# Patient Record
Sex: Male | Born: 1970 | Race: White | Hispanic: No | Marital: Married | Smoking: Never smoker
Health system: Southern US, Community
[De-identification: ages and names within clinical notes are randomized; demographics above are authoritative.]

## PROBLEM LIST (undated history)

## (undated) DIAGNOSIS — G43909 Migraine, unspecified, not intractable, without status migrainosus: Secondary | ICD-10-CM

## (undated) DIAGNOSIS — I10 Essential (primary) hypertension: Secondary | ICD-10-CM

## (undated) DIAGNOSIS — N200 Calculus of kidney: Secondary | ICD-10-CM

## (undated) HISTORY — DX: Calculus of kidney: N20.0

## (undated) HISTORY — PX: NO PAST SURGERIES: SHX2092

## (undated) HISTORY — DX: Migraine, unspecified, not intractable, without status migrainosus: G43.909

---

## 2015-06-11 ENCOUNTER — Emergency Department (HOSPITAL_COMMUNITY): Payer: BLUE CROSS/BLUE SHIELD

## 2015-06-11 ENCOUNTER — Emergency Department (HOSPITAL_COMMUNITY)
Admission: EM | Admit: 2015-06-11 | Discharge: 2015-06-11 | Disposition: A | Discharge status: Home / Self Care | Payer: BLUE CROSS/BLUE SHIELD | Attending: Emergency Medicine | Admitting: Emergency Medicine

## 2015-06-11 ENCOUNTER — Encounter (HOSPITAL_COMMUNITY): Payer: Self-pay | Admitting: Emergency Medicine

## 2015-06-11 DIAGNOSIS — S52591A Other fractures of lower end of right radius, initial encounter for closed fracture: Secondary | ICD-10-CM | POA: Insufficient documentation

## 2015-06-11 DIAGNOSIS — W1789XA Other fall from one level to another, initial encounter: Secondary | ICD-10-CM

## 2015-06-11 DIAGNOSIS — S0990XA Unspecified injury of head, initial encounter: Secondary | ICD-10-CM | POA: Diagnosis not present

## 2015-06-11 DIAGNOSIS — S62124A Nondisplaced fracture of lunate [semilunar], right wrist, initial encounter for closed fracture: Secondary | ICD-10-CM | POA: Insufficient documentation

## 2015-06-11 DIAGNOSIS — Y9389 Activity, other specified: Secondary | ICD-10-CM | POA: Insufficient documentation

## 2015-06-11 DIAGNOSIS — R0789 Other chest pain: Secondary | ICD-10-CM

## 2015-06-11 DIAGNOSIS — S0081XA Abrasion of other part of head, initial encounter: Secondary | ICD-10-CM

## 2015-06-11 DIAGNOSIS — Z23 Encounter for immunization: Secondary | ICD-10-CM | POA: Insufficient documentation

## 2015-06-11 DIAGNOSIS — I10 Essential (primary) hypertension: Secondary | ICD-10-CM | POA: Diagnosis not present

## 2015-06-11 DIAGNOSIS — S0101XA Laceration without foreign body of scalp, initial encounter: Secondary | ICD-10-CM | POA: Diagnosis present

## 2015-06-11 DIAGNOSIS — S52501A Unspecified fracture of the lower end of right radius, initial encounter for closed fracture: Secondary | ICD-10-CM

## 2015-06-11 DIAGNOSIS — S02401A Maxillary fracture, unspecified, initial encounter for closed fracture: Secondary | ICD-10-CM

## 2015-06-11 DIAGNOSIS — Y288XXA Contact with other sharp object, undetermined intent, initial encounter: Secondary | ICD-10-CM | POA: Diagnosis not present

## 2015-06-11 DIAGNOSIS — S62121A Displaced fracture of lunate [semilunar], right wrist, initial encounter for closed fracture: Secondary | ICD-10-CM

## 2015-06-11 DIAGNOSIS — Z79899 Other long term (current) drug therapy: Secondary | ICD-10-CM | POA: Diagnosis not present

## 2015-06-11 DIAGNOSIS — Y998 Other external cause status: Secondary | ICD-10-CM | POA: Insufficient documentation

## 2015-06-11 DIAGNOSIS — Y92812 Truck as the place of occurrence of the external cause: Secondary | ICD-10-CM | POA: Diagnosis not present

## 2015-06-11 DIAGNOSIS — R0781 Pleurodynia: Secondary | ICD-10-CM

## 2015-06-11 HISTORY — DX: Essential (primary) hypertension: I10

## 2015-06-11 MED ORDER — BACITRACIN ZINC 500 UNIT/GM EX OINT: TOPICAL_OINTMENT | Freq: Two times a day (BID) | CUTANEOUS | Status: DC

## 2015-06-11 MED ORDER — LIDOCAINE-EPINEPHRINE (PF) 2 %-1:200000 IJ SOLN: 10.0000 mL | Freq: Once | INTRAMUSCULAR | Status: DC

## 2015-06-11 MED ORDER — TETANUS-DIPHTH-ACELL PERTUSSIS 5-2.5-18.5 LF-MCG/0.5 IM SUSP
0.5000 mL | Freq: Once | INTRAMUSCULAR | Status: AC
  Administered 2015-06-11: 0.5 mL via INTRAMUSCULAR
  Filled 2015-06-11: qty 0.5

## 2015-06-11 MED ORDER — HYDROCODONE-ACETAMINOPHEN 5-325 MG PO TABS: 1.0000 | ORAL_TABLET | Freq: Four times a day (QID) | ORAL | Status: DC | PRN

## 2015-06-11 NOTE — ED Notes (Signed)
On triage assessment, pt admits to feeling presyncopal, pt appears pale - automatic blood pressure reading initially of 70/49 - Melissa S, - Agricultural consultant and advised to move pt to [place] room. Pt remained A&Ox4 during this RNs assessment.

## 2015-06-11 NOTE — Progress Notes (Signed)
Orthopedic Tech Progress Note Patient Details:  Scott Friedman 09/23/1970 [id number]  Ortho Devices Type of Ortho Device: Ace wrap, Arm sling, Sugartong splint Ortho Device/Splint Location: RUE Ortho Device/Splint Interventions: Ordered, Application   Braulio Bosch 06/11/2015, 4:16 PM

## 2015-06-11 NOTE — ED Notes (Signed)
Patient transported to CT 

## 2015-06-11 NOTE — ED Notes (Signed)
Patient undressed (permission given by patient to cut his 2 shirts off), in gown, on monitor, continuous pulse oximetry and blood pressure cuff; visitor at bedside
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
## 2015-06-11 NOTE — ED Notes (Signed)
Pt remains monitored by blood pressure, pulse ox, and 5 lead. pts family remains at bedside.  

## 2015-06-11 NOTE — ED Notes (Signed)
Orto tech coming to apply splint to wrist

## 2015-06-11 NOTE — Discharge Instructions (Signed)
It was our pleasure to provide your ER care today - we hope that you feel better.  Ice/coldpacks to sore area.   You may take hydrocodone as need for pain. No driving for the next 6 hours or when taking hydrocodone. Also, do not take tylenol or acetaminophen containing medication when taking hydrocodone.  Keep abrasions very clean.  You may apply a thin coat of bacitracin for the next few days.  Have sutures right scalp removed in 1 week, by your doctor or urgent care.  For right wrist fractures, wear splint, elevate wrist, and follow up with hand specialist in the coming week - see referral - call office tomorrow morning to arrange appointment.   Return to ER if worse, new symptoms, infection of wounds, fevers, trouble breathing, new and/or severe pain, persistent vomiting, other concern.       Chest Contusion A chest contusion is a deep bruise on your chest area. Contusions are the result of an injury that caused bleeding under the skin. A chest contusion may involve bruising of the skin, muscles, or ribs. The contusion may turn blue, purple, or yellow. Minor injuries will give you a painless contusion, but more severe contusions may stay painful and swollen for a few weeks. CAUSES  A contusion is usually caused by a blow, trauma, or direct force to an area of the body. SYMPTOMS   Swelling and redness of the injured area.  Discoloration of the injured area.  Tenderness and soreness of the injured area.  Pain. DIAGNOSIS  The diagnosis can be made by taking a history and performing a physical exam. An X-ray, CT scan, or MRI may be needed to determine if there were any associated injuries, such as broken bones (fractures) or internal injuries. TREATMENT  Often, the best treatment for a chest contusion is resting, icing, and applying cold compresses to the injured area. Deep breathing exercises may be recommended to reduce the risk of pneumonia. Over-the-counter medicines may also be  recommended for pain control. HOME CARE INSTRUCTIONS   Put ice on the injured area.  Put ice in a plastic bag.  Place a towel between your skin and the bag.  Leave the ice on for 15-20 minutes, 03-04 times a day.  Only take over-the-counter or prescription medicines as directed by your caregiver. Your caregiver may recommend avoiding anti-inflammatory medicines (aspirin, ibuprofen, and naproxen) for 48 hours because these medicines may increase bruising.  Rest the injured area.  Perform deep-breathing exercises as directed by your caregiver.  Stop smoking if you smoke.  Do not lift objects over 5 pounds (2.3 kg) for 3 days or longer if recommended by your caregiver. SEEK IMMEDIATE MEDICAL CARE IF:   You have increased bruising or swelling.  You have pain that is getting worse.  You have difficulty breathing.  You have dizziness, weakness, or fainting.  You have blood in your urine or stool.  You cough up or vomit blood.  Your swelling or pain is not relieved with medicines. MAKE SURE YOU:   Understand these instructions.  Will watch your condition.  Will get help right away if you are not doing well or get worse.   This information is not intended to replace advice given to you by your health care provider. Make sure you discuss any questions you have with your health care provider.   Document Released: 02/25/2001 Document Revised: 02/25/2012 Document Reviewed: 11/24/2011 Elsevier Interactive Patient Education 2016 [place] Injury, Adult You have received  a head injury. It does not appear serious at this time. Headaches and vomiting are common following head injury. It should be easy to awaken from sleeping. Sometimes it is necessary for you to stay in the emergency department for a while for observation. Sometimes admission to the hospital may be needed. After injuries such as yours, most problems occur within the first 24 hours, but side effects may  occur up to 7-10 days after the injury. It is important for you to carefully monitor your condition and contact your health care provider or seek immediate medical care if there is a change in your condition. WHAT ARE THE TYPES OF HEAD INJURIES? Head injuries can be as minor as a bump. Some head injuries can be more severe. More severe head injuries include:  A jarring injury to the brain (concussion).  A bruise of the brain (contusion). This mean there is bleeding in the brain that can cause swelling.  A cracked skull (skull fracture).  Bleeding in the brain that collects, clots, and forms a bump (hematoma). WHAT CAUSES A HEAD INJURY? A serious head injury is most likely to happen to someone who is in a car wreck and is not wearing a seat belt. Other causes of major head injuries include bicycle or motorcycle accidents, sports injuries, and falls. HOW ARE HEAD INJURIES DIAGNOSED? A complete history of the event leading to the injury and your current symptoms will be helpful in diagnosing head injuries. Many times, pictures of the brain, such as CT or MRI are needed to see the extent of the injury. Often, an overnight hospital stay is necessary for observation.  WHEN SHOULD I SEEK IMMEDIATE MEDICAL CARE?  You should get help right away if:  You have confusion or drowsiness.  You feel sick to your stomach (nauseous) or have continued, forceful vomiting.  You have dizziness or unsteadiness that is getting worse.  You have severe, continued headaches not relieved by medicine. Only take over-the-counter or prescription medicines for pain, fever, or discomfort as directed by your health care provider.  You do not have normal function of the arms or legs or are unable to walk.  You notice changes in the black spots in the center of the colored part of your eye (pupil).  You have a clear or bloody fluid coming from your nose or ears.  You have a loss of vision. During the next 24 hours  after the injury, you must stay with someone who can watch you for the warning signs. This person should contact local emergency services (911 in the [place] if you have seizures, you become unconscious, or you are unable to wake up. HOW CAN I PREVENT A HEAD INJURY IN THE FUTURE? The most important factor for preventing major head injuries is avoiding motor vehicle accidents. To minimize the potential for damage to your head, it is crucial to wear seat belts while riding in motor vehicles. Wearing helmets while bike riding and playing collision sports (like football) is also helpful. Also, avoiding dangerous activities around the house will further help reduce your risk of head injury.  WHEN CAN I RETURN TO NORMAL ACTIVITIES AND ATHLETICS? You should be reevaluated by your health care provider before returning to these activities. If you have any of the following symptoms, you should not return to activities or contact sports until 1 week after the symptoms have stopped:  Persistent headache.  Dizziness or vertigo.  Poor attention and concentration.  Confusion.  Memory problems.  Nausea  or vomiting.  Fatigue or tire easily.  Irritability.  Intolerant of bright lights or loud noises.  Anxiety or depression.  Disturbed sleep. MAKE SURE YOU:   Understand these instructions.  Will watch your condition.  Will get help right away if you are not doing well or get worse.   This information is not intended to replace advice given to you by your health care provider. Make sure you discuss any questions you have with your health care provider.   Document Released: 06/02/2005 Document Revised: 06/23/2014 Document Reviewed: 02/07/2013 Elsevier Interactive Patient Education 2016 [place].    Facial or Scalp Contusion A facial or scalp contusion is a deep bruise on the face or head. Injuries to the face and head generally cause a lot of swelling, especially around the eyes. Contusions  are the result of an injury that caused bleeding under the skin. The contusion may turn blue, purple, or yellow. Minor injuries will give you a painless contusion, but more severe contusions may stay painful and swollen for a few weeks.  CAUSES  A facial or scalp contusion is caused by a blunt injury or trauma to the face or head area.  SIGNS AND SYMPTOMS   Swelling of the injured area.   Discoloration of the injured area.   Tenderness, soreness, or pain in the injured area.  DIAGNOSIS  The diagnosis can be made by taking a medical history and doing a physical exam. An X-ray exam, CT scan, or MRI may be needed to determine if there are any associated injuries, such as broken bones (fractures). TREATMENT  Often, the best treatment for a facial or scalp contusion is applying cold compresses to the injured area. Over-the-counter medicines may also be recommended for pain control.  HOME CARE INSTRUCTIONS   Only take over-the-counter or prescription medicines as directed by your health care provider.   Apply ice to the injured area.   Put ice in a plastic bag.   Place a towel between your skin and the bag.   Leave the ice on for 20 minutes, 2-3 times a day.  SEEK MEDICAL CARE IF:  You have bite problems.   You have pain with chewing.   You are concerned about facial defects. SEEK IMMEDIATE MEDICAL CARE IF:  You have severe pain or a headache that is not relieved by medicine.   You have unusual sleepiness, confusion, or personality changes.   You throw up (vomit).   You have a persistent nosebleed.   You have double vision or blurred vision.   You have fluid drainage from your nose or ear.   You have difficulty walking or using your arms or legs.  MAKE SURE YOU:   Understand these instructions.  Will watch your condition.  Will get help right away if you are not doing well or get worse.   This information is not intended to replace advice given to you  by your health care provider. Make sure you discuss any questions you have with your health care provider.   Document Released: 07/10/2004 Document Revised: 06/23/2014 Document Reviewed: 01/13/2013 Elsevier Interactive Patient Education 2016 [place], Adult A laceration is a cut that goes through all of the layers of the skin and into the tissue that is right under the skin. Some lacerations heal on their own. Others need to be closed with stitches (sutures), staples, skin adhesive strips, or skin glue. Proper laceration care minimizes the risk of infection and  helps the laceration to heal better. HOW TO CARE FOR YOUR LACERATION If sutures or staples were used:  Keep the wound clean and dry.  If you were given a bandage (dressing), you should change it at least one time per day or as told by your health care provider. You should also change it if it becomes wet or dirty.  Keep the wound completely dry for the first 24 hours or as told by your health care provider. After that time, you may shower or bathe. However, make sure that the wound is not soaked in water until after the sutures or staples have been removed.  Clean the wound one time each day or as told by your health care provider:  Wash the wound with soap and water.  Rinse the wound with water to remove all soap.  Pat the wound dry with a clean towel. Do not rub the wound.  After cleaning the wound, apply a thin layer of antibiotic ointmentas told by your health care provider. This will help to prevent infection and keep the dressing from sticking to the wound.  Have the sutures or staples removed as told by your health care provider. If skin adhesive strips were used:  Keep the wound clean and dry.  If you were given a bandage (dressing), you should change it at least one time per day or as told by your health care provider. You should also change it if it becomes dirty or wet.  Do not get the  skin adhesive strips wet. You may shower or bathe, but be careful to keep the wound dry.  If the wound gets wet, pat it dry with a clean towel. Do not rub the wound.  Skin adhesive strips fall off on their own. You may trim the strips as the wound heals. Do not remove skin adhesive strips that are still stuck to the wound. They will fall off in time. If skin glue was used:  Try to keep the wound dry, but you may briefly wet it in the shower or bath. Do not soak the wound in water, such as by swimming.  After you have showered or bathed, gently pat the wound dry with a clean towel. Do not rub the wound.  Do not do any activities that will make you sweat heavily until the skin glue has fallen off on its own.  Do not apply liquid, cream, or ointment medicine to the wound while the skin glue is in place. Using those may loosen the film before the wound has healed.  If you were given a bandage (dressing), you should change it at least one time per day or as told by your health care provider. You should also change it if it becomes dirty or wet.  If a dressing is placed over the wound, be careful not to apply tape directly over the skin glue. Doing that may cause the glue to be pulled off before the wound has healed.  Do not pick at the glue. The skin glue usually remains in place for 5-10 days, then it falls off of the skin. General Instructions  Take over-the-counter and prescription medicines only as told by your health care provider.  If you were prescribed an antibiotic medicine or ointment, take or apply it as told by your doctor. Do not stop using it even if your condition improves.  To help prevent scarring, make sure to cover your wound with sunscreen whenever you are outside after stitches are  removed, after adhesive strips are removed, or when glue remains in place and the wound is healed. Make sure to wear a sunscreen of at least 30 SPF.  Do not scratch or pick at the wound.  Keep  all follow-up visits as told by your health care provider. This is important.  Check your wound every day for signs of infection. Watch for:  Redness, swelling, or pain.  Fluid, blood, or pus.  Raise (elevate) the injured area above the level of your heart while you are sitting or lying down, if possible. SEEK MEDICAL CARE IF:  You received a tetanus shot and you have swelling, severe pain, redness, or bleeding at the injection site.  You have a fever.  A wound that was closed breaks open.  You notice a bad smell coming from your wound or your dressing.  You notice something coming out of the wound, such as wood or glass.  Your pain is not controlled with medicine.  You have increased redness, swelling, or pain at the site of your wound.  You have fluid, blood, or pus coming from your wound.  You notice a change in the color of your skin near your wound.  You need to change the dressing frequently due to fluid, blood, or pus draining from the wound.  You develop a new rash.  You develop numbness around the wound. SEEK IMMEDIATE MEDICAL CARE IF:  You develop severe swelling around the wound.  Your pain suddenly increases and is severe.  You develop painful lumps near the wound or on skin that is anywhere on your body.  You have a red streak going away from your wound.  The wound is on your hand or foot and you cannot properly move a finger or toe.  The wound is on your hand or foot and you notice that your fingers or toes look pale or bluish.   This information is not intended to replace advice given to you by your health care provider. Make sure you discuss any questions you have with your health care provider.   Document Released: 06/02/2005 Document Revised: 10/17/2014 Document Reviewed: 05/29/2014 Elsevier Interactive Patient Education 2016 [employer]      Wrist Fracture A wrist fracture is a break or crack in one of the bones of your wrist. Your wrist  is made up of eight small bones at the palm of your hand (carpal bones) and two long bones that make up your forearm (radius and ulna). CAUSES  A direct blow to the wrist.  Falling on an outstretched hand.  Trauma, such as a car accident or a fall. RISK FACTORS Risk factors for wrist fracture include:  Participating in contact and high-risk sports, such as skiing, biking, and ice skating.  Taking steroid medicines.  Smoking.  Being male.  Being Caucasian.  Drinking more than three alcoholic beverages per day.  Having low or lowered bone density (osteoporosis or osteopenia).  Age. Older adults have decreased bone density.  Women who have had menopause.  History of previous fractures. SIGNS AND SYMPTOMS Symptoms of wrist fractures include tenderness, bruising, and inflammation. Additionally, the wrist may hang in an odd position or appear deformed. DIAGNOSIS Diagnosis may include:  Physical exam.  X-ray. TREATMENT Treatment depends on many factors, including the nature and location of the fracture, your age, and your activity level. Treatment for wrist fracture can be nonsurgical or surgical. Nonsurgical Treatment A plaster cast or splint may be applied to your wrist if the  bone is in a good position. If the fracture is not in good position, it may be necessary for your health care provider to realign it before applying a splint or cast. Usually, a cast or splint will be worn for several weeks. Surgical Treatment Sometimes the position of the bone is so far out of place that surgery is required to apply a device to hold it together as it heals. Depending on the fracture, there are a number of options for holding the bone in place while it heals, such as a cast and metal pins. HOME CARE INSTRUCTIONS  Keep your injured wrist elevated and move your fingers as much as possible.  Do not put pressure on any part of your cast or splint. It may break.  Use a plastic bag to  protect your cast or splint from water while bathing or showering. Do not lower your cast or splint into water.  Take medicines only as directed by your health care provider.  Keep your cast or splint clean and dry. If it becomes wet, damaged, or suddenly feels too tight, contact your health care provider right away.  Do not use any tobacco products including cigarettes, chewing tobacco, or electronic cigarettes. Tobacco can delay bone healing. If you need help quitting, ask your health care provider.  Keep all follow-up visits as directed by your health care provider. This is important.  Ask your health care provider if you should take supplements of calcium and vitamins C and D to promote bone healing. SEEK MEDICAL CARE IF:  Your cast or splint is damaged, breaks, or gets wet.  You have a fever.  You have chills.  You have continued severe pain or more swelling than you did before the cast was put on. SEEK IMMEDIATE MEDICAL CARE IF:  Your hand or fingernails on the injured arm turn blue or gray, or feel cold or numb.  You have decreased feeling in the fingers of your injured arm. MAKE SURE YOU:  Understand these instructions.  Will watch your condition.  Will get help right away if you are not doing well or get worse.   This information is not intended to replace advice given to you by your health care provider. Make sure you discuss any questions you have with your health care provider.   Document Released: 03/12/2005 Document Revised: 02/21/2015 Document Reviewed: 06/20/2011 Elsevier Interactive Patient Education 2016 [employer]     Abrasion An abrasion is a cut or scrape on the outer surface of your skin. An abrasion does not extend through all of the layers of your skin. It is important to care for your abrasion properly to prevent infection. CAUSES Most abrasions are caused by falling on or gliding across the ground or another surface. When your skin rubs on  something, the outer and inner layer of skin rubs off.  SYMPTOMS A cut or scrape is the main symptom of this condition. The scrape may be bleeding, or it may appear red or pink. If there was an associated fall, there may be an underlying bruise. DIAGNOSIS An abrasion is diagnosed with a physical exam. TREATMENT Treatment for this condition depends on how large and deep the abrasion is. Usually, your abrasion will be cleaned with water and mild soap. This removes any dirt or debris that may be stuck. An antibiotic ointment may be applied to the abrasion to help prevent infection. A bandage (dressing) may be placed on the abrasion to keep it clean. You may also  need a tetanus shot. HOME CARE INSTRUCTIONS Medicines  Take or apply medicines only as directed by your health care provider.  If you were prescribed an antibiotic ointment, finish all of it even if you start to feel better. Wound Care  Clean the wound with mild soap and water 2-3 times per day or as directed by your health care provider. Pat your wound dry with a clean towel. Do not rub it.  There are many different ways to close and cover a wound. Follow instructions from your health care provider about:  Wound care.  Dressing changes and removal.  Check your wound every day for signs of infection. Watch for:  Redness, swelling, or pain.  Fluid, blood, or pus. General Instructions  Keep the dressing dry as directed by your health care provider. Do not take baths, swim, use a hot tub, or do anything that would put your wound underwater until your health care provider approves.  If there is swelling, raise (elevate) the injured area above the level of your heart while you are sitting or lying down.  Keep all follow-up visits as directed by your health care provider. This is important. SEEK MEDICAL CARE IF:  You received a tetanus shot and you have swelling, severe pain, redness, or bleeding at the injection site.  Your  pain is not controlled with medicine.  You have increased redness, swelling, or pain at the site of your wound. SEEK IMMEDIATE MEDICAL CARE IF:  You have a red streak going away from your wound.  You have a fever.  You have fluid, blood, or pus coming from your wound.  You notice a bad smell coming from your wound or your dressing.   This information is not intended to replace advice given to you by your health care provider. Make sure you discuss any questions you have with your health care provider.   Document Released: 03/12/2005 Document Revised: 02/21/2015 Document Reviewed: 05/31/2014 Elsevier Interactive Patient Education 2016 [place] or Splint Care Casts and splints support injured limbs and keep bones from moving while they heal. It is important to care for your cast or splint at home.  HOME CARE INSTRUCTIONS  Keep the cast or splint uncovered during the drying period. It can take 24 to 48 hours to dry if it is made of plaster. A fiberglass cast will dry in less than 1 hour.  Do not rest the cast on anything harder than a pillow for the first 24 hours.  Do not put weight on your injured limb or apply pressure to the cast until your health care provider gives you permission.  Keep the cast or splint dry. Wet casts or splints can lose their shape and may not support the limb as well. A wet cast that has lost its shape can also create harmful pressure on your skin when it dries. Also, wet skin can become infected.  Cover the cast or splint with a plastic bag when bathing or when out in the rain or snow. If the cast is on the trunk of the body, take sponge baths until the cast is removed.  If your cast does become wet, dry it with a towel or a blow dryer on the cool setting only.  Keep your cast or splint clean. Soiled casts may be wiped with a moistened cloth.  Do not place any hard or soft foreign objects under your cast or splint, such as cotton, toilet  paper, lotion, or  powder.  Do not try to scratch the skin under the cast with any object. The object could get stuck inside the cast. Also, scratching could lead to an infection. If itching is a problem, use a blow dryer on a cool setting to relieve discomfort.  Do not trim or cut your cast or remove padding from inside of it.  Exercise all joints next to the injury that are not immobilized by the cast or splint. For example, if you have a long leg cast, exercise the hip joint and toes. If you have an arm cast or splint, exercise the shoulder, elbow, thumb, and fingers.  Elevate your injured arm or leg on 1 or 2 pillows for the first 1 to 3 days to decrease swelling and pain.It is best if you can comfortably elevate your cast so it is higher than your heart. SEEK MEDICAL CARE IF:   Your cast or splint cracks.  Your cast or splint is too tight or too loose.  You have unbearable itching inside the cast.  Your cast becomes wet or develops a soft spot or area.  You have a bad smell coming from inside your cast.  You get an object stuck under your cast.  Your skin around the cast becomes red or raw.  You have new pain or worsening pain after the cast has been applied. SEEK IMMEDIATE MEDICAL CARE IF:   You have fluid leaking through the cast.  You are unable to move your fingers or toes.  You have discolored (blue or white), cool, painful, or very swollen fingers or toes beyond the cast.  You have tingling or numbness around the injured area.  You have severe pain or pressure under the cast.  You have any difficulty with your breathing or have shortness of breath.  You have chest pain.   This information is not intended to replace advice given to you by your health care provider. Make sure you discuss any questions you have with your health care provider.   Document Released: 05/30/2000 Document Revised: 03/23/2013 Document Reviewed: 12/09/2012 Elsevier Interactive Patient  Education 2016 [employer].    Cryotherapy Cryotherapy means treatment with cold. Ice or gel packs can be used to reduce both pain and swelling. Ice is the most helpful within the first 24 to 48 hours after an injury or flare-up from overusing a muscle or joint. Sprains, strains, spasms, burning pain, shooting pain, and aches can all be eased with ice. Ice can also be used when recovering from surgery. Ice is effective, has very few side effects, and is safe for most people to use. PRECAUTIONS  Ice is not a safe treatment option for people with:  Raynaud phenomenon. This is a condition affecting small blood vessels in the extremities. Exposure to cold may cause your problems to return.  Cold hypersensitivity. There are many forms of cold hypersensitivity, including:  Cold urticaria. Red, itchy hives appear on the skin when the tissues begin to warm after being iced.  Cold erythema. This is a red, itchy rash caused by exposure to cold.  Cold hemoglobinuria. Red blood cells break down when the tissues begin to warm after being iced. The hemoglobin that carry oxygen are passed into the urine because they cannot combine with blood proteins fast enough.  Numbness or altered sensitivity in the area being iced. If you have any of the following conditions, do not use ice until you have discussed cryotherapy with your caregiver:  Heart conditions, such as arrhythmia,  angina, or chronic heart disease.  High blood pressure.  Healing wounds or open skin in the area being iced.  Current infections.  Rheumatoid arthritis.  Poor circulation.  Diabetes. Ice slows the blood flow in the region it is applied. This is beneficial when trying to stop inflamed tissues from spreading irritating chemicals to surrounding tissues. However, if you expose your skin to cold temperatures for too long or without the proper protection, you can damage your skin or nerves. Watch for signs of skin damage due to  cold. HOME CARE INSTRUCTIONS Follow these tips to use ice and cold packs safely.  Place a dry or damp towel between the ice and skin. A damp towel will cool the skin more quickly, so you may need to shorten the time that the ice is used.  For a more rapid response, add gentle compression to the ice.  Ice for no more than 10 to 20 minutes at a time. The bonier the area you are icing, the less time it will take to get the benefits of ice.  Check your skin after 5 minutes to make sure there are no signs of a poor response to cold or skin damage.  Rest 20 minutes or more between uses.  Once your skin is numb, you can end your treatment. You can test numbness by very lightly touching your skin. The touch should be so light that you do not see the skin dimple from the pressure of your fingertip. When using ice, most people will feel these normal sensations in this order: cold, burning, aching, and numbness.  Do not use ice on someone who cannot communicate their responses to pain, such as small children or people with dementia. HOW TO MAKE AN ICE PACK Ice packs are the most common way to use ice therapy. Other methods include ice massage, ice baths, and cryosprays. Muscle creams that cause a cold, tingly feeling do not offer the same benefits that ice offers and should not be used as a substitute unless recommended by your caregiver. To make an ice pack, do one of the following:  Place crushed ice or a bag of frozen vegetables in a sealable plastic bag. Squeeze out the excess air. Place this bag inside another plastic bag. Slide the bag into a pillowcase or place a damp towel between your skin and the bag.  Mix 3 parts water with 1 part rubbing alcohol. Freeze the mixture in a sealable plastic bag. When you remove the mixture from the freezer, it will be slushy. Squeeze out the excess air. Place this bag inside another plastic bag. Slide the bag into a pillowcase or place a damp towel between your  skin and the bag. SEEK MEDICAL CARE IF:  You develop white spots on your skin. This may give the skin a blotchy (mottled) appearance.  Your skin turns blue or pale.  Your skin becomes waxy or hard.  Your swelling gets worse. MAKE SURE YOU:   Understand these instructions.  Will watch your condition.  Will get help right away if you are not doing well or get worse.   This information is not intended to replace advice given to you by your health care provider. Make sure you discuss any questions you have with your health care provider.   Document Released: 01/27/2011 Document Revised: 06/23/2014 Document Reviewed: 01/27/2011 Elsevier Interactive Patient Education [employer].

## 2015-06-11 NOTE — ED Notes (Signed)
Pt was in back of truck and tripped over the tailgate hitting his head on the ground , no loc pt has a lac to the right side of head , pain to jaw right wrist and right side of ribs

## 2016-05-12 ENCOUNTER — Encounter: Payer: Self-pay | Admitting: Family Medicine

## 2016-05-12 ENCOUNTER — Ambulatory Visit (INDEPENDENT_AMBULATORY_CARE_PROVIDER_SITE_OTHER): Payer: BLUE CROSS/BLUE SHIELD | Admitting: Family Medicine

## 2016-05-12 VITALS — BP 117/71 | HR 70 | Temp 97.7°F | Resp 14 | Ht 72.0 in | Wt 214.0 lb

## 2016-05-12 DIAGNOSIS — Z0000 Encounter for general adult medical examination without abnormal findings: Secondary | ICD-10-CM

## 2016-05-12 DIAGNOSIS — I10 Essential (primary) hypertension: Secondary | ICD-10-CM | POA: Insufficient documentation

## 2016-05-12 DIAGNOSIS — E1159 Type 2 diabetes mellitus with other circulatory complications: Secondary | ICD-10-CM | POA: Insufficient documentation

## 2016-05-12 LAB — COMPREHENSIVE METABOLIC PANEL
ALBUMIN: 4.1 g/dL (ref 3.5–5.2)
ALK PHOS: 50 U/L (ref 39–117)
ALT: 22 U/L (ref 0–53)
AST: 18 U/L (ref 0–37)
BILIRUBIN TOTAL: 0.8 mg/dL (ref 0.2–1.2)
BUN: 15 mg/dL (ref 6–23)
CALCIUM: 9.5 mg/dL (ref 8.4–10.5)
CO2: 31 mEq/L (ref 19–32)
CREATININE: 1.02 mg/dL (ref 0.40–1.50)
Chloride: 103 mEq/L (ref 96–112)
GFR: 83.15 mL/min (ref >60.00)
Glucose, Bld: 86 mg/dL (ref 70–99)
Potassium: 3.7 mEq/L (ref 3.5–5.1)
Sodium: 140 mEq/L (ref 135–145)
TOTAL PROTEIN: 6.9 g/dL (ref 6.0–8.3)

## 2016-05-12 LAB — LIPID PANEL
CHOLESTEROL: 188 mg/dL (ref 0–200)
HDL: 31.4 mg/dL — AB (ref >39.00)
NONHDL: 157.04
TRIGLYCERIDES: 250 mg/dL — AB (ref 0.0–149.0)
Total CHOL/HDL Ratio: 6
VLDL: 50 mg/dL — ABNORMAL HIGH (ref 0.0–40.0)

## 2016-05-12 LAB — HEMOGLOBIN A1C: Hgb A1c MFr Bld: 5.8 % (ref 4.6–6.5)

## 2016-05-12 LAB — CBC
HEMATOCRIT: 46.6 % (ref 39.0–52.0)
HEMOGLOBIN: 15.6 g/dL (ref 13.0–17.0)
MCHC: 33.5 g/dL (ref 30.0–36.0)
MCV: 89.2 fl (ref 78.0–100.0)
PLATELETS: 238 10*3/uL (ref 150.0–400.0)
RBC: 5.22 Mil/uL (ref 4.22–5.81)
RDW: 13.1 % (ref 11.5–15.5)
WBC: 7.5 10*3/uL (ref 4.0–10.5)

## 2016-05-12 LAB — LDL CHOLESTEROL, DIRECT: Direct LDL: 122 mg/dL

## 2016-05-12 MED ORDER — LISINOPRIL-HYDROCHLOROTHIAZIDE 20-25 MG PO TABS: 1.0000 | ORAL_TABLET | Freq: Every day | ORAL | 3 refills | Status: DC

## 2016-05-12 NOTE — Progress Notes (Signed)
Subjective:  Patient ID: Scott Friedman, male    DOB: April 01, 1971  Age: 45 y.o. MRN: [id number]  CC: Establish care  HPI Scott Friedman is a 45 y.o. male presents to the clinic today to establish care. He is in need of a physical exam.  Preventative Healthcare  Colonoscopy: Not indicated yet.  Immunizations  Tetanus - Up to date.  Pneumococcal - Not indicated.   Flu - Declines.  Prostate cancer screening: No First-degree family history of. Will hold off on screening.  Labs: Screening labs today.  Alcohol use: Occasional.   Smoking/tobacco use: Nonsmoker.  Regular dental exams: Yes.   Wears seat belt: Yes.   PMH, Surgical Hx, Family Hx, Social History reviewed and updated as below.  Past Medical History:  Diagnosis Date  . Hypertension   . Kidney stones   . Migraine    Past Surgical History:  Procedure Laterality Date  . NO PAST SURGERIES     Family History  Problem Relation Age of Onset  . Breast cancer Mother   . Hypertension Mother   . Lung cancer Father   . Colon cancer Maternal Uncle   . Stroke Maternal Grandmother   . Prostate cancer Maternal Uncle    Social History  Substance Use Topics  . Smoking status: Never Smoker  . Smokeless tobacco: Never Used  . Alcohol use 0.0 - 0.6 oz/week   Review of Systems General: Denies unexplained weight loss, fever. Skin: Denies new or changing mole, sore/wound that won't heal. ENT: Trouble hearing, ringing in the ears, sores in the mouth, hoarseness, trouble swallowing. Eyes: Denies trouble seeing/visual disturbance. Heart/CV: Denies chest pain, shortness of breath, edema, palpitations. Lungs/Resp: Denies cough, shortness of breath, hemoptysis. Abd/GI: Denies nausea, vomiting, diarrhea, constipation, abdominal pain, hematochezia, melena. GU: Denies dysuria, incontinence, hematuria, urinary frequency, difficulty starting/keeping stream, penile discharge, sexual difficulty, lump in testicles. MSK: Denies joint  pain/swelling, myalgias. Neuro: Denies headaches, weakness, numbness, dizziness, syncope. Psych: Denies sadness, anxiety, stress, memory difficulty. Endocrine: Denies polyuria and polydipsia.  Objective:   Today's Vitals: BP 117/71 (BP Location: Left Arm, Patient Position: Sitting, Cuff Size: Normal)   Pulse 70   Temp 97.7 F (36.5 C) (Oral)   Resp 14   Ht 6' (1.829 m) Comment: with shoes  Wt 214 lb (97.1 kg)   SpO2 97%   BMI 29.02 kg/m   Physical Exam  Constitutional: He is oriented to person, place, and time. He appears well-developed. No distress.  HENT:  Head: Normocephalic and atraumatic.  Mouth/Throat: Oropharynx is clear and moist.  Eyes: Conjunctivae are normal.  Neck: Neck supple.  Cardiovascular: Normal rate and regular rhythm.   Soft systolic murmur ([id number]).  Pulmonary/Chest: Effort normal and breath sounds normal.  Abdominal: Soft. He exhibits no distension. There is no tenderness. There is no rebound and no guarding.  Musculoskeletal: Normal range of motion. He exhibits no edema.  Lymphadenopathy:    He has no cervical adenopathy.  Neurological: He is alert and oriented to person, place, and time.  Skin: No rash noted.  Psychiatric: He has a normal mood and affect.  Vitals reviewed.  Assessment & Plan:   Problem List Items Addressed This Visit    Annual physical exam - Primary    Tetanus up-to-date. Declines influenza. Screening labs today. BP well controlled. Continue Lisinopril/HCTZ (refilled today).       Relevant Orders   CBC   Hemoglobin A1c   Comprehensive metabolic panel   Lipid panel     Outpatient  Encounter Prescriptions as of 05/12/2016  Medication Sig  . lisinopril-hydrochlorothiazide (PRINZIDE,ZESTORETIC) 20-25 MG tablet Take 1 tablet by mouth daily.  . [DISCONTINUED] lisinopril-hydrochlorothiazide (PRINZIDE,ZESTORETIC) 20-25 MG tablet Take 1 tablet by mouth daily.  . [DISCONTINUED] HYDROcodone-acetaminophen (NORCO/VICODIN) 5-325 MG  tablet Take 1-2 tablets by mouth every 6 (six) hours as needed for moderate pain.   No facility-administered encounter medications on file as of 05/12/2016.     Follow-up: Annually  [place]

## 2016-05-12 NOTE — Assessment & Plan Note (Addendum)
Tetanus up-to-date. Declines influenza. Screening labs today. BP well controlled. Continue Lisinopril/HCTZ (refilled today).

## 2016-05-12 NOTE — Patient Instructions (Signed)
Continue your medication.  Follow up annually.  Take care  Dr. Lacinda Axon   Health Maintenance, Male A healthy lifestyle and preventative care can promote health and wellness.  Maintain regular health, dental, and eye exams.  Eat a healthy diet. Foods like vegetables, fruits, whole grains, low-fat dairy products, and lean protein foods contain the nutrients you need and are low in calories. Decrease your intake of foods high in solid fats, added sugars, and salt. Get information about a proper diet from your health care provider, if necessary.  Regular physical exercise is one of the most important things you can do for your health. Most adults should get at least 150 minutes of moderate-intensity exercise (any activity that increases your heart rate and causes you to sweat) each week. In addition, most adults need muscle-strengthening exercises on 2 or more days a week.   Maintain a healthy weight. The body mass index (BMI) is a screening tool to identify possible weight problems. It provides an estimate of body fat based on height and weight. Your health care provider can find your BMI and can help you achieve or maintain a healthy weight. For males 20 years and older:  A BMI below 18.5 is considered underweight.  A BMI of 18.5 to 24.9 is normal.  A BMI of 25 to 29.9 is considered overweight.  A BMI of 30 and above is considered obese.  Maintain normal blood lipids and cholesterol by exercising and minimizing your intake of saturated fat. Eat a balanced diet with plenty of fruits and vegetables. Blood tests for lipids and cholesterol should begin at age 1 and be repeated every 5 years. If your lipid or cholesterol levels are high, you are over age 68, or you are at high risk for heart disease, you may need your cholesterol levels checked more frequently.Ongoing high lipid and cholesterol levels should be treated with medicines if diet and exercise are not working.  If you smoke, find out  from your health care provider how to quit. If you do not use tobacco, do not start.  Lung cancer screening is recommended for adults aged 72-80 years who are at high risk for developing lung cancer because of a history of smoking. A yearly low-dose CT scan of the lungs is recommended for people who have at least a 30-pack-year history of smoking and are current smokers or have quit within the past 15 years. A pack year of smoking is smoking an average of 1 pack of cigarettes a day for 1 year (for example, a 30-pack-year history of smoking could mean smoking 1 pack a day for 30 years or 2 packs a day for 15 years). Yearly screening should continue until the smoker has stopped smoking for at least 15 years. Yearly screening should be stopped for people who develop a health problem that would prevent them from having lung cancer treatment.  If you choose to drink alcohol, do not have more than 2 drinks per day. One drink is considered to be 12 oz (360 mL) of beer, 5 oz (150 mL) of wine, or 1.5 oz (45 mL) of liquor.  Avoid the use of street drugs. Do not share needles with anyone. Ask for help if you need support or instructions about stopping the use of drugs.  High blood pressure causes heart disease and increases the risk of stroke. High blood pressure is more likely to develop in:  People who have blood pressure in the end of the normal range (100-139/85-89 mm  Hg).  People who are overweight or obese.  People who are African American.  If you are 40-44 years of age, have your blood pressure checked every 3-5 years. If you are 57 years of age or older, have your blood pressure checked every year. You should have your blood pressure measured twice-once when you are at a hospital or clinic, and once when you are not at a hospital or clinic. Record the average of the two measurements. To check your blood pressure when you are not at a hospital or clinic, you can use:  An automated blood pressure  machine at a pharmacy.  A home blood pressure monitor.  If you are 70-75 years old, ask your health care provider if you should take aspirin to prevent heart disease.  Diabetes screening involves taking a blood sample to check your fasting blood sugar level. This should be done once every 3 years after age 8 if you are at a normal weight and without risk factors for diabetes. Testing should be considered at a younger age or be carried out more frequently if you are overweight and have at least 1 risk factor for diabetes.  Colorectal cancer can be detected and often prevented. Most routine colorectal cancer screening begins at the age of 54 and continues through age 61. However, your health care provider may recommend screening at an earlier age if you have risk factors for colon cancer. On a yearly basis, your health care provider may provide home test kits to check for hidden blood in the stool. A small camera at the end of a tube may be used to directly examine the colon (sigmoidoscopy or colonoscopy) to detect the earliest forms of colorectal cancer. Talk to your health care provider about this at age 26 when routine screening begins. A direct exam of the colon should be repeated every 5-10 years through age 35, unless early forms of precancerous polyps or small growths are found.  People who are at an increased risk for hepatitis B should be screened for this virus. You are considered at high risk for hepatitis B if:  You were born in a country where hepatitis B occurs often. Talk with your health care provider about which countries are considered high risk.  Your parents were born in a high-risk country and you have not received a shot to protect against hepatitis B (hepatitis B vaccine).  You have HIV or AIDS.  You use needles to inject street drugs.  You live with, or have sex with, someone who has hepatitis B.  You are a man who has sex with other men (MSM).  You get hemodialysis  treatment.  You take certain medicines for conditions like cancer, organ transplantation, and autoimmune conditions.  Hepatitis C blood testing is recommended for all people born from [birth year] through 1967 and any individual with known risk factors for hepatitis C.  Healthy men should no longer receive prostate-specific antigen (PSA) blood tests as part of routine cancer screening. Talk to your health care provider about prostate cancer screening.  Testicular cancer screening is not recommended for adolescents or adult males who have no symptoms. Screening includes self-exam, a health care provider exam, and other screening tests. Consult with your health care provider about any symptoms you have or any concerns you have about testicular cancer.  Practice safe sex. Use condoms and avoid high-risk sexual practices to reduce the spread of sexually transmitted infections (STIs).  You should be screened for STIs, including gonorrhea and  chlamydia if:  You are sexually active and are younger than 24 years.  You are older than 24 years, and your health care provider tells you that you are at risk for this type of infection.  Your sexual activity has changed since you were last screened, and you are at an increased risk for chlamydia or gonorrhea. Ask your health care provider if you are at risk.  If you are at risk of being infected with HIV, it is recommended that you take a prescription medicine daily to prevent HIV infection. This is called pre-exposure prophylaxis (PrEP). You are considered at risk if:  You are a man who has sex with other men (MSM).  You are a heterosexual man who is sexually active with multiple partners.  You take drugs by injection.  You are sexually active with a partner who has HIV.  Talk with your health care provider about whether you are at high risk of being infected with HIV. If you choose to begin PrEP, you should first be tested for HIV. You should then be tested  every 3 months for as long as you are taking PrEP.  Use sunscreen. Apply sunscreen liberally and repeatedly throughout the day. You should seek shade when your shadow is shorter than you. Protect yourself by wearing long sleeves, pants, a wide-brimmed hat, and sunglasses year round whenever you are outdoors.  Tell your health care provider of new moles or changes in moles, especially if there is a change in shape or color. Also, tell your health care provider if a mole is larger than the size of a pencil eraser.  A one-time screening for abdominal aortic aneurysm (AAA) and surgical repair of large AAAs by ultrasound is recommended for men aged 18-75 years who are current or former smokers.  Stay current with your vaccines (immunizations). This information is not intended to replace advice given to you by your health care provider. Make sure you discuss any questions you have with your health care provider. Document Released: 11/29/2007 Document Revised: 06/23/2014 Document Reviewed: 03/06/2015 Elsevier Interactive Patient Education  2017 [employer].

## 2016-07-15 IMAGING — DX DG WRIST COMPLETE 3+V*R*
4 series · 4 of 4 positions shown · non-contrast
Comparison: None available

CLINICAL DATA: Fall from back of truck, acute right wrist pain

EXAM:
RIGHT WRIST - COMPLETE 3+ VIEW

[wrist pa]
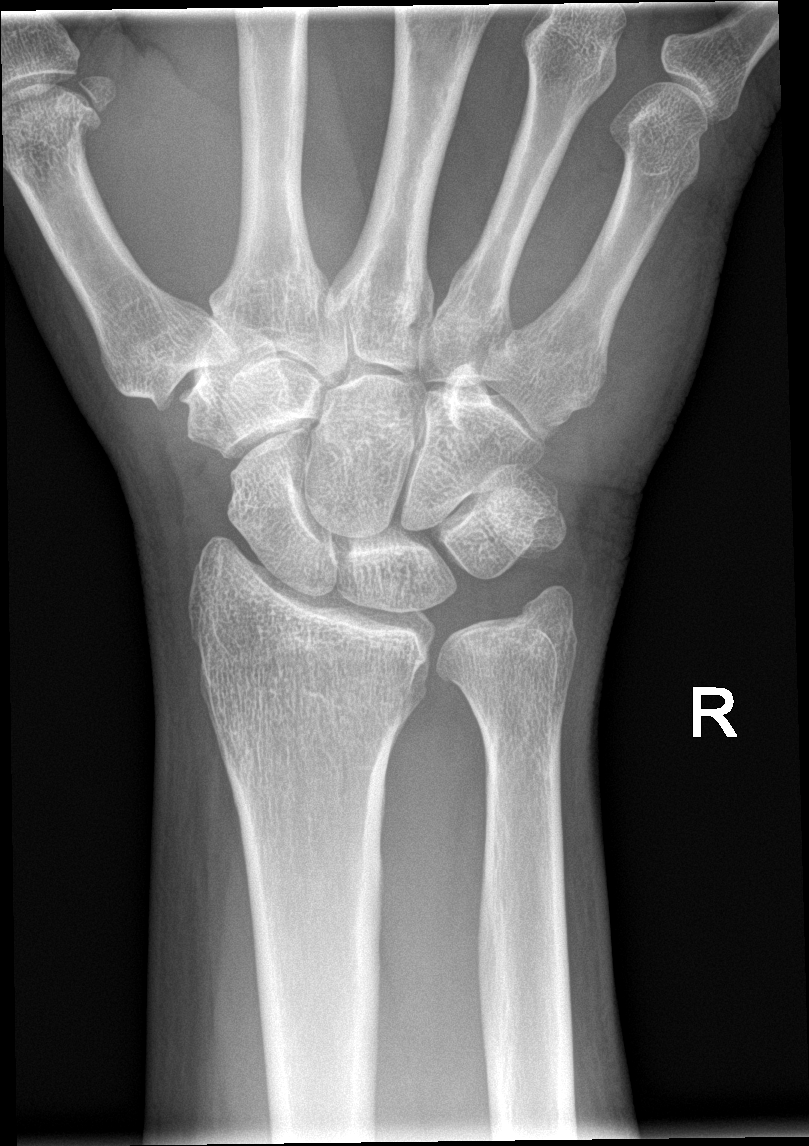

[wrist obl]
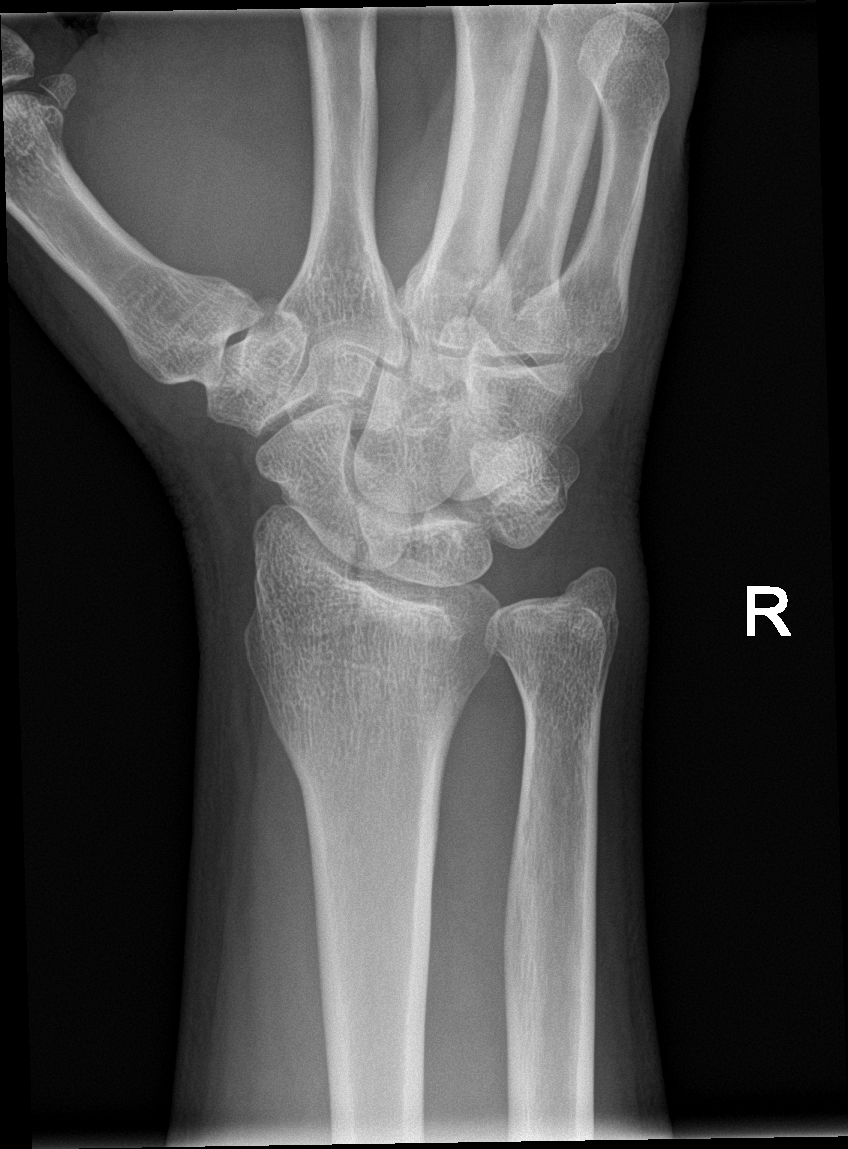

[wrist lat]
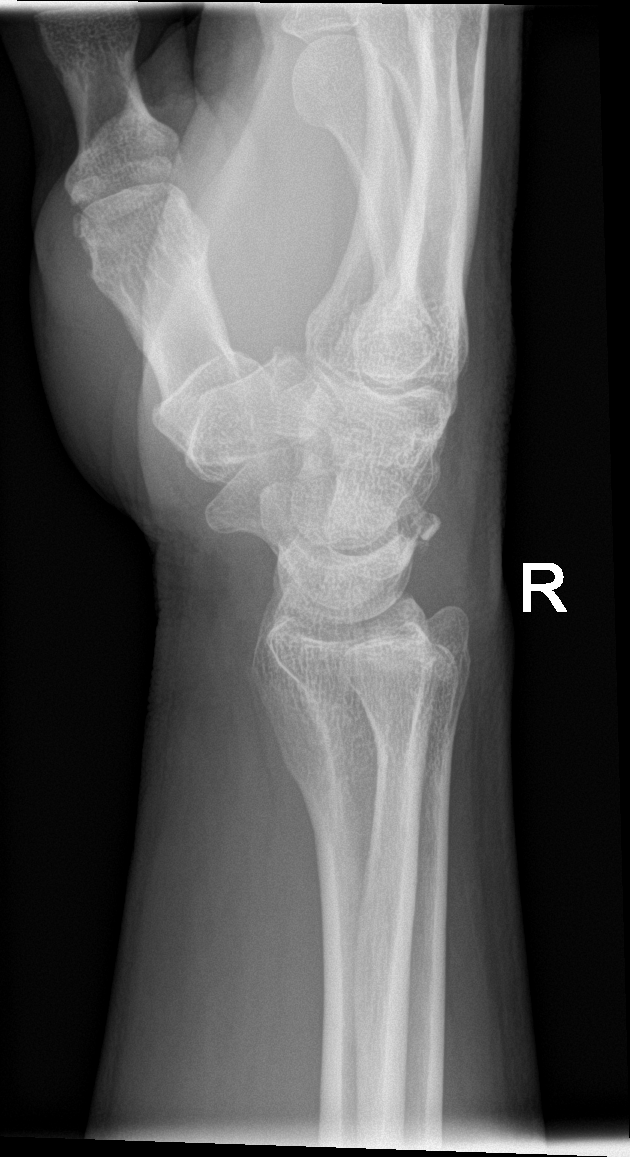

[wrist navicular]
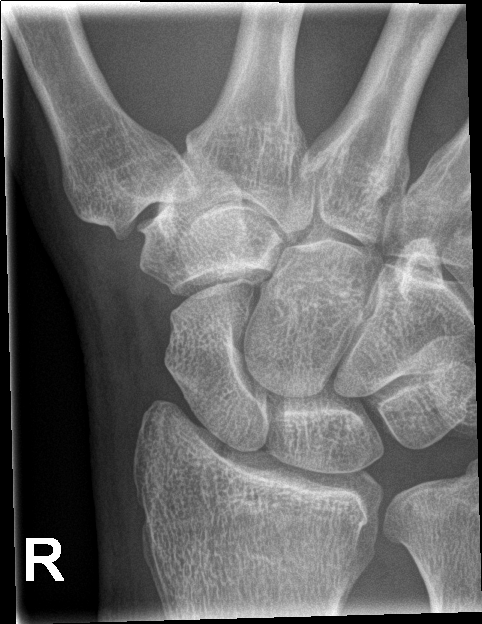

[4 of 4 positions shown; findings below may reference images not displayed]

FINDINGS: Subtle lucency through the distal radius articular surface on the
oblique and navicular views suspicious for a nondisplaced distal
radius intra-articular fracture. On the lateral view, there is a
small dorsal chip like avulsion fracture of the carpal bones,
suspect from the lunate. Mild soft tissue swelling. Distal ulna
intact.
IMPRESSION: Findings suspicious for a nondisplaced right distal radius
intra-articular fracture.

Associated dorsal avulsion fracture along the carpal bones, suspect
from the lunate

No wrist malalignment.

## 2017-01-19 ENCOUNTER — Encounter: Payer: Self-pay | Admitting: Family Medicine

## 2017-05-13 ENCOUNTER — Encounter: Payer: BLUE CROSS/BLUE SHIELD | Admitting: Family Medicine

## 2017-05-17 ENCOUNTER — Other Ambulatory Visit: Payer: Self-pay | Admitting: Family Medicine

## 2017-05-21 NOTE — Telephone Encounter (Signed)
30 tabs sent to pharmacy. He has an appointment on Monday with me. Please make sure he keeps this if we are able to stay open with the weather. Thanks.

## 2017-05-21 NOTE — Telephone Encounter (Signed)
Patient has not been seen since 05/12/16, but has an appointment with PCP on 07/26/16.

## 2017-05-22 ENCOUNTER — Telehealth: Payer: Self-pay | Admitting: Family Medicine

## 2017-05-22 NOTE — Telephone Encounter (Signed)
Pt needs a refill pt appt was resch due to the weather. lisinopril-hydrochlorothiazide (PRINZIDE,ZESTORETIC) 20-25 MG tablet  Call pt @ [phone]. Thank you!

## 2017-05-22 NOTE — Telephone Encounter (Signed)
Last OV 05/12/16 with Dr.Cook, last filled 05/21/17

## 2017-05-25 ENCOUNTER — Encounter: Payer: BLUE CROSS/BLUE SHIELD | Admitting: Family Medicine

## 2017-06-15 ENCOUNTER — Other Ambulatory Visit: Payer: Self-pay

## 2017-06-15 MED ORDER — LISINOPRIL-HYDROCHLOROTHIAZIDE 20-25 MG PO TABS: 1.0000 | ORAL_TABLET | Freq: Every day | ORAL | 0 refills | Status: DC

## 2017-06-18 ENCOUNTER — Other Ambulatory Visit: Payer: Self-pay

## 2017-06-18 MED ORDER — LISINOPRIL-HYDROCHLOROTHIAZIDE 20-25 MG PO TABS: 1.0000 | ORAL_TABLET | Freq: Every day | ORAL | 0 refills | Status: DC

## 2017-07-30 ENCOUNTER — Encounter: Payer: Self-pay | Admitting: Family Medicine

## 2017-07-30 ENCOUNTER — Other Ambulatory Visit: Payer: Self-pay

## 2017-07-30 ENCOUNTER — Ambulatory Visit (INDEPENDENT_AMBULATORY_CARE_PROVIDER_SITE_OTHER): Payer: BLUE CROSS/BLUE SHIELD | Admitting: Family Medicine

## 2017-07-30 ENCOUNTER — Telehealth: Payer: Self-pay | Admitting: Family Medicine

## 2017-07-30 VITALS — BP 138/98 | HR 65 | Temp 98.1°F | Ht 72.5 in | Wt 220.8 lb

## 2017-07-30 DIAGNOSIS — E663 Overweight: Secondary | ICD-10-CM

## 2017-07-30 DIAGNOSIS — E7800 Pure hypercholesterolemia, unspecified: Secondary | ICD-10-CM

## 2017-07-30 DIAGNOSIS — Z0000 Encounter for general adult medical examination without abnormal findings: Secondary | ICD-10-CM | POA: Diagnosis not present

## 2017-07-30 DIAGNOSIS — Z114 Encounter for screening for human immunodeficiency virus [HIV]: Secondary | ICD-10-CM

## 2017-07-30 DIAGNOSIS — I10 Essential (primary) hypertension: Secondary | ICD-10-CM | POA: Diagnosis not present

## 2017-07-30 LAB — LIPID PANEL
CHOLESTEROL: 178 mg/dL (ref 0–200)
HDL: 33 mg/dL — AB (ref >39.00)
LDL CALC: 111 mg/dL — AB (ref 0–99)
NonHDL: 144.7
TRIGLYCERIDES: 167 mg/dL — AB (ref 0.0–149.0)
Total CHOL/HDL Ratio: 5
VLDL: 33.4 mg/dL (ref 0.0–40.0)

## 2017-07-30 LAB — COMPREHENSIVE METABOLIC PANEL
ALBUMIN: 4.2 g/dL (ref 3.5–5.2)
ALK PHOS: 47 U/L (ref 39–117)
ALT: 28 U/L (ref 0–53)
AST: 18 U/L (ref 0–37)
BUN: 16 mg/dL (ref 6–23)
CALCIUM: 9.4 mg/dL (ref 8.4–10.5)
CHLORIDE: 101 meq/L (ref 96–112)
CO2: 33 mEq/L — ABNORMAL HIGH (ref 19–32)
Creatinine, Ser: 0.96 mg/dL (ref 0.40–1.50)
GFR: 88.72 mL/min (ref >60.00)
Glucose, Bld: 115 mg/dL — ABNORMAL HIGH (ref 70–99)
POTASSIUM: 3.9 meq/L (ref 3.5–5.1)
SODIUM: 140 meq/L (ref 135–145)
TOTAL PROTEIN: 7 g/dL (ref 6.0–8.3)
Total Bilirubin: 0.7 mg/dL (ref 0.2–1.2)

## 2017-07-30 LAB — HEMOGLOBIN A1C: Hgb A1c MFr Bld: 6 % (ref 4.6–6.5)

## 2017-07-30 NOTE — Assessment & Plan Note (Addendum)
Well-controlled at home.  Continue current medication.  He will continue to monitor.

## 2017-07-30 NOTE — Telephone Encounter (Signed)
Copied from [place] [id number]. Topic: General - Other >> Jul 30, 2017 10:36 AM Neva Seat wrote: Pt called to speak w/ Janett Billow - Dr. Ellen Henri nurse.  Pt needs a call back asap.

## 2017-07-30 NOTE — Patient Instructions (Signed)
Nice to see you. Please work on some dietary changes.  I have included diet information below. We will call you with your lab results. Please continue to monitor your blood pressure.  Diet Recommendations  Starchy (carb) foods: Bread, rice, pasta, potatoes, corn, cereal, grits, crackers, bagels, muffins, all baked goods.  (Fruits, milk, and yogurt also have carbohydrate, but most of these foods will not spike your blood sugar as the starchy foods will.)  A few fruits do cause high blood sugars; use small portions of bananas (limit to 1/2 at a time), grapes, watermelon, oranges, and most tropical fruits.    Protein foods: Meat, fish, poultry, eggs, dairy foods, and beans such as pinto and kidney beans (beans also provide carbohydrate).   1. Eat at least 3 meals and 1-2 snacks per day. Never go more than 4-5 hours while awake without eating. Eat breakfast within the first hour of getting up.   2. Limit starchy foods to TWO per meal and ONE per snack. ONE portion of a starchy  food is equal to the following:   - ONE slice of bread (or its equivalent, such as half of a hamburger bun).   - 1/2 cup of a "scoopable" starchy food such as potatoes or rice.   - 15 grams of carbohydrate as shown on food label.  3. Include at every meal: a protein food, a carb food, and vegetables and/or fruit.   - Obtain twice the volume of veg's as protein or carbohydrate foods for both lunch and dinner.   - Fresh or frozen veg's are best.   - Keep frozen veg's on hand for a quick vegetable serving.

## 2017-07-30 NOTE — Progress Notes (Signed)
Patient states his uncle had colon cancer not prostate

## 2017-07-30 NOTE — Telephone Encounter (Signed)
Patient state she did have an uncle with prostate cancer

## 2017-07-30 NOTE — Progress Notes (Signed)
Tommi Rumps, MD Phone: [phone]  Scott Friedman is a 47 y.o. male who presents today for physical exam.  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 Eats whatever he wants.  2 sodas per day.  On sweet tea.  Biscuit for breakfast.  Grilled chicken and regular food for lunch and dinner. Tetanus vaccination up-to-date.  Declines flu shot. Due for HIV testing.  He opts in 2 HIV testing.   He has an uncle with a history of colon cancer though no first-degree relatives.  He denies family history of prostate cancer.  No tobacco use or illicit drug use.  Seldom alcohol use.   BP typically runs 120 over 70s at home.  Does take his medication.  Active Ambulatory Problems    Diagnosis Date Noted  . Essential hypertension 05/12/2016  . Annual physical exam 05/12/2016   Resolved Ambulatory Problems    Diagnosis Date Noted  . No Resolved Ambulatory Problems   Past Medical History:  Diagnosis Date  . Hypertension   . Kidney stones   . Migraine     Family History  Problem Relation Age of Onset  . Breast cancer Mother   . Hypertension Mother   . Lung cancer Father   . Colon cancer Maternal Uncle   . Stroke Maternal Grandmother   . Prostate cancer Maternal Uncle     Social History   Socioeconomic History  . Marital status: Married    Spouse name: Not on file  . Number of children: Not on file  . Years of education: Not on file  . Highest education level: Not on file  Social Needs  . Financial resource strain: Not on file  . Food insecurity - worry: Not on file  . Food insecurity - inability: Not on file  . Transportation needs - medical: Not on file  . Transportation needs - non-medical: Not on file  Occupational History  . Not on file  Tobacco Use  . Smoking status: Never Smoker  . Smokeless tobacco: Never Used  Substance and Sexual Activity  . Alcohol use: Yes    Alcohol/week: 0.0 - 0.6 oz  . Drug use: No  . Sexual activity: Yes    Partners: Female    Other Topics Concern  . Not on file  Social History Narrative  . Not on file    ROS  General:  Negative for nexplained weight loss, fever Skin: Negative for new or changing mole, sore that won't heal HEENT: Negative for trouble hearing, trouble seeing, ringing in ears, mouth sores, hoarseness, change in voice, dysphagia. CV:  Negative for chest pain, dyspnea, edema, palpitations Resp: Negative for cough, dyspnea, hemoptysis GI: Negative for nausea, vomiting, diarrhea, constipation, abdominal pain, melena, hematochezia. GU: Negative for dysuria, incontinence, urinary hesitance, hematuria, vaginal or penile discharge, polyuria, sexual difficulty, lumps in testicle or breasts MSK: Negative for muscle cramps or aches, joint pain or swelling Neuro: Negative for headaches, weakness, numbness, dizziness, passing out/fainting Psych: Negative for depression, anxiety, memory problems  Objective  Physical Exam Vitals:   07/30/17 0934  BP: (!) 138/98  Pulse: 65  Temp: 98.1 F (36.7 C)  SpO2: 97%    BP Readings from Last 3 Encounters:  07/30/17 (!) 138/98  05/12/16 117/71  06/11/15 121/80   Wt Readings from Last 3 Encounters:  07/30/17 220 lb 12.8 oz (100.2 kg)  05/12/16 214 lb (97.1 kg)    Physical Exam  Constitutional: No distress.  HENT:  Head: Normocephalic and atraumatic.  Mouth/Throat:  Oropharynx is clear and moist. No oropharyngeal exudate.  Eyes: Conjunctivae are normal. Pupils are equal, round, and reactive to light.  Neck: Neck supple.  Cardiovascular: Normal rate, regular rhythm and normal heart sounds.  Pulmonary/Chest: Effort normal and breath sounds normal.  Abdominal: Soft. Bowel sounds are normal. He exhibits no distension. There is no tenderness. There is no rebound and no guarding.  Musculoskeletal: He exhibits no edema.  Lymphadenopathy:    He has no cervical adenopathy.  Neurological: He is alert. Gait normal.  Skin: Skin is warm and dry. He is not  diaphoretic.  Psychiatric: Mood and affect normal.     Assessment/Plan:   Annual physical exam Physical exam completed.  Tetanus vaccination up-to-date.  He declines flu shot.  Screening lab work to be completed.  Encouraged diet and exercise.  Dietary information given. After the patient left the office it was noted that his maternal uncle was listed as having a history of prostate cancer. He denied family history of prostate cancer in the office. I will have Janett Billow contact the patient to confirm this.   Essential hypertension Well-controlled at home.  Continue current medication.  He will continue to monitor.   Orders Placed This Encounter  Procedures  . Comp Met (CMET)  . Lipid panel  . HgB A1c  . HIV antibody (with reflex)    No orders of the defined types were placed in this encounter.    Tommi Rumps, MD [place]

## 2017-07-30 NOTE — Assessment & Plan Note (Addendum)
Physical exam completed.  Tetanus vaccination up-to-date.  He declines flu shot.  Screening lab work to be completed.  Encouraged diet and exercise.  Dietary information given. After the patient left the office it was noted that his maternal uncle was listed as having a history of prostate cancer. He denied family history of prostate cancer in the office. I will have Scott Friedman contact the patient to confirm this.

## 2017-07-30 NOTE — Telephone Encounter (Signed)
Left message to return call, ok for pec to speak to patient and get more detail 

## 2017-07-31 DIAGNOSIS — R7303 Prediabetes: Secondary | ICD-10-CM | POA: Insufficient documentation

## 2017-07-31 DIAGNOSIS — E118 Type 2 diabetes mellitus with unspecified complications: Secondary | ICD-10-CM | POA: Insufficient documentation

## 2017-07-31 LAB — HIV ANTIBODY (ROUTINE TESTING W REFLEX): HIV: NONREACTIVE

## 2017-07-31 NOTE — Telephone Encounter (Signed)
Noted.  He will need follow-up in 3 months per other result note.  We can discuss prostate cancer screening at follow-up unless he wants to come back for a screening PSA.

## 2017-07-31 NOTE — Telephone Encounter (Signed)
Patient is scheduled   

## 2017-07-31 NOTE — Progress Notes (Signed)
The 10-year ASCVD risk score Scott Friedman., et al., 2013) is: 5%   Values used to calculate the score:     Age: 47 years     Sex: Male     Is Non-Hispanic African American: No     Diabetic: No     Tobacco smoker: No     Systolic Blood Pressure: 407 mmHg     Is BP treated: Yes     HDL Cholesterol: 33 mg/dL     Total Cholesterol: 178 mg/dL

## 2017-10-28 ENCOUNTER — Other Ambulatory Visit: Payer: Self-pay

## 2017-10-28 ENCOUNTER — Ambulatory Visit: Payer: BLUE CROSS/BLUE SHIELD | Admitting: Family Medicine

## 2017-10-28 ENCOUNTER — Encounter: Payer: Self-pay | Admitting: Family Medicine

## 2017-10-28 DIAGNOSIS — E7800 Pure hypercholesterolemia, unspecified: Secondary | ICD-10-CM | POA: Diagnosis not present

## 2017-10-28 DIAGNOSIS — I10 Essential (primary) hypertension: Secondary | ICD-10-CM

## 2017-10-28 DIAGNOSIS — R7303 Prediabetes: Secondary | ICD-10-CM

## 2017-10-28 NOTE — Assessment & Plan Note (Signed)
Well-controlled.  Continue current regimen.  Follow-up in 3 months for labs and visit.

## 2017-10-28 NOTE — Assessment & Plan Note (Signed)
Slightly elevated though ASCVD risk score is now less than 5%.  Discussed no need for cholesterol medication at this time.  We will continue to work on diet and exercise.

## 2017-10-28 NOTE — Assessment & Plan Note (Signed)
Asymptomatic.  I suspect the increased thirst recently is related to some slight dehydration given increased outside activity.  He will continue to work on diet and exercise.

## 2017-10-28 NOTE — Patient Instructions (Signed)
Nice to see you. Please continue with your dietary changes and continue to stay active.  Please make sure you drink plenty of fluids. We will see you back in 3 months for a visit and lab work.

## 2017-10-28 NOTE — Progress Notes (Signed)
  Tommi Rumps, MD Phone: [phone]  Scott Friedman is a 47 y.o. male who presents today for f/u.  HYPERTENSION  Disease Monitoring  Home BP Monitoring 120/80 Chest pain- no    Dyspnea- no Medications  Compliance-  Taking lisinopril/HCTZ.   Edema- no  Prediabetes: Patient notes may be some increased thirst at the end of the day this past week because he has been outside more though otherwise no polydipsia or polyuria.  He stays very active with his job.  He has been eating more grilled chicken and salads as well as vegetables.  He does drink 3 sodas/sweet teas per day.  Elevated LDL: He notes no claudication.  He is work on diet changes as outlined above.    Social History   Tobacco Use  Smoking Status Never Smoker  Smokeless Tobacco Never Used     ROS see history of present illness  Objective  Physical Exam Vitals:   10/28/17 0817  BP: 122/78  Pulse: 71  Temp: 98.3 F (36.8 C)  SpO2: 97%    BP Readings from Last 3 Encounters:  10/28/17 122/78  07/30/17 (!) 138/98  05/12/16 117/71   Wt Readings from Last 3 Encounters:  10/28/17 223 lb (101.2 kg)  07/30/17 220 lb 12.8 oz (100.2 kg)  05/12/16 214 lb (97.1 kg)    Physical Exam  Constitutional: No distress.  Cardiovascular: Normal rate, regular rhythm and normal heart sounds.  Pulmonary/Chest: Effort normal and breath sounds normal.  Musculoskeletal: He exhibits no edema.  Neurological: He is alert.  Skin: Skin is warm and dry. He is not diaphoretic.     Assessment/Plan: Please see individual problem list.  Essential hypertension Well-controlled.  Continue current regimen.  Follow-up in 3 months for labs and visit.  Prediabetes Asymptomatic.  I suspect the increased thirst recently is related to some slight dehydration given increased outside activity.  He will continue to work on diet and exercise.  Elevated LDL cholesterol level Slightly elevated though ASCVD risk score is now less than 5%.   Discussed no need for cholesterol medication at this time.  We will continue to work on diet and exercise.   No orders of the defined types were placed in this encounter.   No orders of the defined types were placed in this encounter.  The 10-year ASCVD risk score Mikey Bussing DC Brooke Bonito., et al., 2013) is: 4%   Values used to calculate the score:     Age: 53 years     Sex: Male     Is Non-Hispanic African American: No     Diabetic: No     Tobacco smoker: No     Systolic Blood Pressure: 291 mmHg     Is BP treated: Yes     HDL Cholesterol: 33 mg/dL     Total Cholesterol: 178 mg/dL   Tommi Rumps, MD [place]

## 2017-11-06 ENCOUNTER — Other Ambulatory Visit: Payer: Self-pay | Admitting: Family Medicine

## 2018-01-28 ENCOUNTER — Ambulatory Visit: Payer: BLUE CROSS/BLUE SHIELD | Admitting: Family Medicine

## 2018-02-04 ENCOUNTER — Ambulatory Visit: Payer: BLUE CROSS/BLUE SHIELD | Admitting: Family Medicine

## 2018-02-05 ENCOUNTER — Ambulatory Visit: Payer: BLUE CROSS/BLUE SHIELD | Admitting: Family Medicine

## 2018-02-06 ENCOUNTER — Other Ambulatory Visit: Payer: Self-pay | Admitting: Family Medicine

## 2018-03-17 ENCOUNTER — Ambulatory Visit: Payer: BLUE CROSS/BLUE SHIELD | Admitting: Family Medicine

## 2018-03-17 ENCOUNTER — Encounter: Payer: Self-pay | Admitting: Family Medicine

## 2018-03-17 DIAGNOSIS — R7303 Prediabetes: Secondary | ICD-10-CM | POA: Diagnosis not present

## 2018-03-17 DIAGNOSIS — I10 Essential (primary) hypertension: Secondary | ICD-10-CM | POA: Diagnosis not present

## 2018-03-17 LAB — BASIC METABOLIC PANEL
BUN: 16 mg/dL (ref 6–23)
CALCIUM: 9.4 mg/dL (ref 8.4–10.5)
CHLORIDE: 102 meq/L (ref 96–112)
CO2: 31 mEq/L (ref 19–32)
CREATININE: 1.04 mg/dL (ref 0.40–1.50)
GFR: 80.68 mL/min (ref >60.00)
Glucose, Bld: 110 mg/dL — ABNORMAL HIGH (ref 70–99)
Potassium: 3.5 mEq/L (ref 3.5–5.1)
Sodium: 140 mEq/L (ref 135–145)

## 2018-03-17 LAB — HEMOGLOBIN A1C: HEMOGLOBIN A1C: 5.9 % (ref 4.6–6.5)

## 2018-03-17 NOTE — Patient Instructions (Signed)
Nice to see you. Please try to cut down on sweet tea and soda. We will check lab work and contact you with the results.

## 2018-03-17 NOTE — Assessment & Plan Note (Signed)
At goal.  Check BMP.  Continue current medication.

## 2018-03-17 NOTE — Assessment & Plan Note (Signed)
Encouraged diet and exercise.  Check A1c. °

## 2018-03-17 NOTE — Progress Notes (Signed)
  Tommi Rumps, MD Phone: [phone]  Scott Friedman is a 47 y.o. male who presents today for f/u.  CC: htn, prediabetes  HYPERTENSION  Disease Monitoring  Home BP Monitoring 115/78 Chest pain- no    Dyspnea- no Medications  Compliance-  Taking lisinopril/HCTZ.  Edema- no  Prediabetes: Patient notes no polyuria or polydipsia.  He eats mostly grilled and broiled foods.  Lots of vegetables.  He does drink 2-3 sodas or sweet teas per day.  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  Social History   Tobacco Use  Smoking Status Never Smoker  Smokeless Tobacco Never Used     ROS see history of present illness  Objective  Physical Exam Vitals:   03/17/18 1112  BP: 110/80  Pulse: 71  Temp: 98.4 F (36.9 C)  SpO2: 96%    BP Readings from Last 3 Encounters:  03/17/18 110/80  10/28/17 122/78  07/30/17 (!) 138/98   Wt Readings from Last 3 Encounters:  03/17/18 214 lb 9.6 oz (97.3 kg)  10/28/17 223 lb (101.2 kg)  07/30/17 220 lb 12.8 oz (100.2 kg)    Physical Exam  Constitutional: No distress.  Cardiovascular: Normal rate, regular rhythm and normal heart sounds.  Pulmonary/Chest: Effort normal and breath sounds normal.  Musculoskeletal: He exhibits no edema.  Neurological: He is alert.  Skin: Skin is warm and dry. He is not diaphoretic.     Assessment/Plan: Please see individual problem list.  Prediabetes Encouraged diet and exercise.  Check A1c.  Essential hypertension At goal.  Check BMP.  Continue current medication.   Orders Placed This Encounter  Procedures  . Basic Metabolic Panel (BMET)  . HgB A1c    No orders of the defined types were placed in this encounter.    Tommi Rumps, MD [place]

## 2018-06-02 ENCOUNTER — Other Ambulatory Visit: Payer: Self-pay | Admitting: Family Medicine

## 2018-06-03 MED ORDER — LISINOPRIL-HYDROCHLOROTHIAZIDE 20-25 MG PO TABS: 1.0000 | ORAL_TABLET | Freq: Every day | ORAL | 0 refills | Status: DC

## 2018-08-25 ENCOUNTER — Other Ambulatory Visit: Payer: Self-pay | Admitting: Family Medicine

## 2018-09-22 ENCOUNTER — Other Ambulatory Visit: Payer: Self-pay

## 2018-09-22 ENCOUNTER — Telehealth: Payer: Self-pay | Admitting: Family Medicine

## 2018-09-22 ENCOUNTER — Ambulatory Visit (INDEPENDENT_AMBULATORY_CARE_PROVIDER_SITE_OTHER): Payer: No Typology Code available for payment source | Admitting: Family Medicine

## 2018-09-22 DIAGNOSIS — I10 Essential (primary) hypertension: Secondary | ICD-10-CM

## 2018-09-22 DIAGNOSIS — R7303 Prediabetes: Secondary | ICD-10-CM | POA: Diagnosis not present

## 2018-09-22 NOTE — Telephone Encounter (Signed)
Called pt and left a VM to call back on my direct number to schedule pt for needed appts per PCP

## 2018-09-22 NOTE — Assessment & Plan Note (Signed)
Well-controlled per report.  We will have him come in for lab work.  Follow-up in 6 months.  Continue current medication.

## 2018-09-22 NOTE — Assessment & Plan Note (Signed)
Encouraged continued activity and dietary changes.  Discussed decreasing soda and sweet tea intake.  Check A1c with lab work.

## 2018-09-22 NOTE — Telephone Encounter (Signed)
Please call the patient and get him set up for fasting lab work sometime in the next month.  He needs 24-month follow-up scheduled as well.  Thanks.

## 2018-09-22 NOTE — Progress Notes (Addendum)
Virtual Visit via Telephone Note  This visit type was conducted due to national recommendations for restrictions regarding the COVID-19 pandemic (e.g. social distancing).  This format is felt to be most appropriate for this patient at this time.  All issues noted in this document were discussed and addressed.  No physical exam was performed (except for noted visual exam findings with Video Visits).   I connected with Scott Friedman on 09/22/18 at  8:00 AM EDT by telephone and verified that I am speaking with the correct person using two identifiers. Location patient: work Location provider: work Persons participating in the virtual visit: patient, provider  I discussed the limitations, risks, security and privacy concerns of performing an evaluation and management service by telephone and the availability of in person appointments. I also discussed with the patient that there may be a patient responsible charge related to this service. The patient expressed understanding and agreed to proceed.  Interactive audio and video telecommunications were attempted between this provider and patient, however failed, due to patient having technical difficulties OR patient did not have access to video capability.  We continued and completed visit with audio only.   Reason for visit: 6 month follow-up  HPI: HYPERTENSION  Disease Monitoring  Home BP Monitoring 117/78 Chest pain- no    Dyspnea- no Medications  Compliance-  Taking lisinopril/hctz.   Edema- no  Prediabetes: No polyuria or polydipsia.  Notes he has been eating at home more frequently recently has restaurants are close.  Eating more grilled chicken.  He does have 2-3 sodas or sweet teas per day.  He is incredibly active with his job.   ROS: See pertinent positives and negatives per HPI.  Past Medical History:  Diagnosis Date  . Hypertension   . Kidney stones   . Migraine     Past Surgical History:  Procedure Laterality Date  . NO PAST  SURGERIES      Family History  Problem Relation Age of Onset  . Breast cancer Mother   . Hypertension Mother   . Lung cancer Father   . Colon cancer Maternal Uncle   . Stroke Maternal Grandmother   . Prostate cancer Maternal Uncle     SOCIAL HX: Non-smoker   Current Outpatient Medications:  .  lisinopril-hydrochlorothiazide (PRINZIDE,ZESTORETIC) 20-25 MG tablet, TAKE 1 TABLET BY MOUTH EVERY DAY, Disp: 90 tablet, Rfl: 0  EXAM:  VITALS per patient if applicable: Weight: 700 pounds  No physical exam was completed given this was a telehealth telephone visit.  ASSESSMENT AND PLAN:  Discussed the following assessment and plan:  Essential hypertension  Prediabetes  Essential hypertension Well-controlled per report.  We will have him come in for lab work.  Follow-up in 6 months.  Continue current medication.  Prediabetes Encouraged continued activity and dietary changes.  Discussed decreasing soda and sweet tea intake.  Check A1c with lab work.  Discussed coronavirus social distancing precautions and sick precautions.   I discussed the assessment and treatment plan with the patient. The patient was provided an opportunity to ask questions and all were answered. The patient agreed with the plan and demonstrated an understanding of the instructions.   The patient was advised to call back or seek an in-person evaluation if the symptoms worsen or if the condition fails to improve as anticipated.  I provided 13 minutes of non-face-to-face time during this encounter.   Tommi Rumps, MD

## 2018-09-22 NOTE — Telephone Encounter (Signed)
Pt called back and has been scheduled for needed appts

## 2018-09-24 ENCOUNTER — Other Ambulatory Visit: Payer: Self-pay | Admitting: Family Medicine

## 2018-10-28 ENCOUNTER — Other Ambulatory Visit: Payer: Self-pay

## 2018-10-28 ENCOUNTER — Other Ambulatory Visit (INDEPENDENT_AMBULATORY_CARE_PROVIDER_SITE_OTHER): Payer: No Typology Code available for payment source

## 2018-10-28 DIAGNOSIS — R7303 Prediabetes: Secondary | ICD-10-CM | POA: Diagnosis not present

## 2018-10-28 DIAGNOSIS — I10 Essential (primary) hypertension: Secondary | ICD-10-CM | POA: Diagnosis not present

## 2018-10-28 LAB — COMPREHENSIVE METABOLIC PANEL
ALT: 24 U/L (ref 0–53)
AST: 18 U/L (ref 0–37)
Albumin: 4.2 g/dL (ref 3.5–5.2)
Alkaline Phosphatase: 54 U/L (ref 39–117)
BUN: 20 mg/dL (ref 6–23)
CO2: 30 mEq/L (ref 19–32)
Calcium: 9.1 mg/dL (ref 8.4–10.5)
Chloride: 102 mEq/L (ref 96–112)
Creatinine, Ser: 1.04 mg/dL (ref 0.40–1.50)
GFR: 75.71 mL/min (ref >60.00)
Glucose, Bld: 115 mg/dL — ABNORMAL HIGH (ref 70–99)
Potassium: 3.9 mEq/L (ref 3.5–5.1)
Sodium: 140 mEq/L (ref 135–145)
Total Bilirubin: 0.8 mg/dL (ref 0.2–1.2)
Total Protein: 6.5 g/dL (ref 6.0–8.3)

## 2018-10-28 LAB — LIPID PANEL
Cholesterol: 184 mg/dL (ref 0–200)
HDL: 30.6 mg/dL — ABNORMAL LOW (ref >39.00)
NonHDL: 153.15
Total CHOL/HDL Ratio: 6
Triglycerides: 203 mg/dL — ABNORMAL HIGH (ref 0.0–149.0)
VLDL: 40.6 mg/dL — ABNORMAL HIGH (ref 0.0–40.0)

## 2018-10-28 LAB — LDL CHOLESTEROL, DIRECT: Direct LDL: 112 mg/dL

## 2018-10-28 LAB — HEMOGLOBIN A1C: Hgb A1c MFr Bld: 6.1 % (ref 4.6–6.5)

## 2018-12-29 ENCOUNTER — Other Ambulatory Visit: Payer: Self-pay | Admitting: Family Medicine

## 2019-03-28 ENCOUNTER — Ambulatory Visit: Payer: No Typology Code available for payment source | Admitting: Family Medicine

## 2019-03-28 ENCOUNTER — Encounter: Payer: Self-pay | Admitting: Family Medicine

## 2019-03-28 ENCOUNTER — Other Ambulatory Visit: Payer: Self-pay

## 2019-03-28 VITALS — BP 118/70 | HR 65 | Temp 98.4°F | Ht 72.0 in | Wt 212.8 lb

## 2019-03-28 DIAGNOSIS — R7303 Prediabetes: Secondary | ICD-10-CM

## 2019-03-28 DIAGNOSIS — I10 Essential (primary) hypertension: Secondary | ICD-10-CM | POA: Diagnosis not present

## 2019-03-28 DIAGNOSIS — E7800 Pure hypercholesterolemia, unspecified: Secondary | ICD-10-CM

## 2019-03-28 DIAGNOSIS — Z1211 Encounter for screening for malignant neoplasm of colon: Secondary | ICD-10-CM | POA: Diagnosis not present

## 2019-03-28 LAB — BASIC METABOLIC PANEL
BUN: 17 mg/dL (ref 6–23)
CO2: 30 mEq/L (ref 19–32)
Calcium: 9.3 mg/dL (ref 8.4–10.5)
Chloride: 102 mEq/L (ref 96–112)
Creatinine, Ser: 1 mg/dL (ref 0.40–1.50)
GFR: 79.09 mL/min (ref >60.00)
Glucose, Bld: 116 mg/dL — ABNORMAL HIGH (ref 70–99)
Potassium: 3.4 mEq/L — ABNORMAL LOW (ref 3.5–5.1)
Sodium: 141 mEq/L (ref 135–145)

## 2019-03-28 LAB — HEMOGLOBIN A1C: Hgb A1c MFr Bld: 6.1 % (ref 4.6–6.5)

## 2019-03-28 NOTE — Assessment & Plan Note (Signed)
Refer for colonoscopy 

## 2019-03-28 NOTE — Assessment & Plan Note (Signed)
Continue physical activity.  He will try to decrease soda and sweet tea intake.  Check A1c.

## 2019-03-28 NOTE — Assessment & Plan Note (Signed)
Does not meet criteria for treatment.  Discussed continuing physical activity and dietary changes.

## 2019-03-28 NOTE — Assessment & Plan Note (Signed)
Well-controlled.  Continue current regimen.  Check BMP. 

## 2019-03-28 NOTE — Progress Notes (Signed)
  Tommi Rumps, MD Phone: [phone]  Scott Friedman is a 48 y.o. male who presents today for f/u.  HYPERTENSION  Disease Monitoring  Home BP Monitoring 118/70 Chest pain- no    Dyspnea- no Medications  Compliance-  Taking lisinopril/hctz.   Edema- no  Hyperlipidemia: No claudication.  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  Diet consists of a little bit of everything.  He gets plenty of vegetables.  He does drink 2-3 sodas per day and 2 sweet teas per day.  Prediabetes: No polyuria or polydipsia.  Family history of colon cancer: He notes 2 uncles had colon cancer.  He is due for colonoscopy.  No first-degree relatives with colon cancer.    Social History   Tobacco Use  Smoking Status Never Smoker  Smokeless Tobacco Never Used     ROS see history of present illness  Objective  Physical Exam Vitals:   03/28/19 0806  BP: 118/70  Pulse: 65  Temp: 98.4 F (36.9 C)  SpO2: 96%    BP Readings from Last 3 Encounters:  03/28/19 118/70  03/17/18 110/80  10/28/17 122/78   Wt Readings from Last 3 Encounters:  03/28/19 212 lb 12.8 oz (96.5 kg)  03/17/18 214 lb 9.6 oz (97.3 kg)  10/28/17 223 lb (101.2 kg)    Physical Exam Constitutional:      General: He is not in acute distress.    Appearance: He is not diaphoretic.  Cardiovascular:     Rate and Rhythm: Normal rate and regular rhythm.     Heart sounds: Normal heart sounds.  Pulmonary:     Effort: Pulmonary effort is normal.     Breath sounds: Normal breath sounds.  Musculoskeletal:     Right lower leg: No edema.     Left lower leg: No edema.  Skin:    General: Skin is warm and dry.  Neurological:     Mental Status: He is alert.      Assessment/Plan: Please see individual problem list.  Essential hypertension Well-controlled.  Continue current regimen.  Check BMP.  Elevated LDL cholesterol level Does not meet criteria for treatment.  Discussed continuing physical activity and dietary  changes.  Prediabetes Continue physical activity.  He will try to decrease soda and sweet tea intake.  Check A1c.  Colon cancer screening Refer for colonoscopy.   Health Maintenance: Patient declines flu vaccine.  Orders Placed This Encounter  Procedures  . Basic Metabolic Panel (BMET)  . HgB A1c  . Ambulatory referral to Gastroenterology    Referral Priority:   Routine    Referral Type:   Consultation    Referral Reason:   Specialty Services Required    Number of Visits Requested:   1    No orders of the defined types were placed in this encounter.    Tommi Rumps, MD [place]

## 2019-03-28 NOTE — Patient Instructions (Signed)
Nice to see you. Please continue to monitor your blood pressure. Please try to decrease soda intake and sweet tea intake. We will get you scheduled for a colonoscopy. We will call you with your lab results.

## 2019-04-03 ENCOUNTER — Other Ambulatory Visit: Payer: Self-pay | Admitting: Family Medicine

## 2019-04-07 ENCOUNTER — Encounter: Payer: Self-pay | Admitting: *Deleted

## 2019-04-07 ENCOUNTER — Telehealth: Payer: Self-pay | Admitting: Gastroenterology

## 2019-04-07 NOTE — Telephone Encounter (Signed)
Gastroenterology Pre-Procedure Review  Request Date: Thursday 05/05/2019  [place]   Requesting Physician: Dr. Allen Norris  PATIENT REVIEW QUESTIONS: The patient responded to the following health history questions as indicated:    1. Are you having any GI issues? no 2. Do you have a personal history of Polyps? no 3. Do you have a family history of Colon Cancer or Polyps? yes (Uncle from Mother's side w/ CC) 4. Diabetes Mellitus? no 5. Joint replacements in the past 12 months?no 6. Major health problems in the past 3 months?no 7. Any artificial heart valves, MVP, or defibrillator?no   BMI:  28.86      Pulmonary disease: No  MEDICATIONS & ALLERGIES:    Patient reports the following regarding taking any anticoagulation/antiplatelet therapy:   Plavix, Coumadin, Eliquis, Xarelto, Lovenox, Pradaxa, Brilinta, or Effient? no Aspirin? no  Patient confirms/reports the following medications:  Current Outpatient Medications  Medication Sig Dispense Refill  . lisinopril-hydrochlorothiazide (ZESTORETIC) 20-25 MG tablet TAKE 1 TABLET BY MOUTH EVERY DAY 90 tablet 1   No current facility-administered medications for this visit.     Patient confirms/reports the following allergies:  No Known Allergies  No orders of the defined types were placed in this encounter.   AUTHORIZATION INFORMATION Primary Insurance: 1D#: Group #:  Secondary Insurance: 1D#: Group #:  SCHEDULE INFORMATION: Date:  Time: Location:

## 2019-04-11 ENCOUNTER — Other Ambulatory Visit: Payer: Self-pay

## 2019-04-11 DIAGNOSIS — Z1211 Encounter for screening for malignant neoplasm of colon: Secondary | ICD-10-CM

## 2019-04-22 ENCOUNTER — Telehealth: Payer: Self-pay

## 2019-04-22 NOTE — Telephone Encounter (Signed)
Returned patients call regarding his request to reschedule his Colonoscopy with Dr.Wohl from 05/06/19 to Monday 05/09/19 at [facility]. LVM informing him that Dr. Allen Norris is off on 11/23 and he has one more spot available on Monday 11/30, and Monday Dec 7th.  If he would like one of these spots asked him to call me back ASAP to schedule one of those dates or another date.  Thanks [employer]

## 2019-05-09 ENCOUNTER — Other Ambulatory Visit: Payer: Self-pay

## 2019-05-09 ENCOUNTER — Encounter: Payer: Self-pay | Admitting: *Deleted

## 2019-05-11 NOTE — Discharge Instructions (Signed)

## 2019-05-13 ENCOUNTER — Other Ambulatory Visit
Admission: RE | Admit: 2019-05-13 | Discharge: 2019-05-13 | Disposition: A | Discharge status: Home / Self Care | Payer: No Typology Code available for payment source | Source: Ambulatory Visit | Attending: Gastroenterology | Admitting: Gastroenterology

## 2019-05-13 DIAGNOSIS — Z20828 Contact with and (suspected) exposure to other viral communicable diseases: Secondary | ICD-10-CM | POA: Diagnosis not present

## 2019-05-13 DIAGNOSIS — Z01812 Encounter for preprocedural laboratory examination: Secondary | ICD-10-CM | POA: Insufficient documentation

## 2019-05-13 LAB — SARS CORONAVIRUS 2 (TAT 6-24 HRS): SARS Coronavirus 2: NEGATIVE

## 2019-05-16 ENCOUNTER — Ambulatory Visit: Payer: No Typology Code available for payment source | Admitting: Anesthesiology

## 2019-05-16 ENCOUNTER — Other Ambulatory Visit: Payer: Self-pay

## 2019-05-16 ENCOUNTER — Encounter
Admission: RE | Disposition: A | Discharge status: Home / Self Care | Payer: Self-pay | Source: Home / Self Care | Attending: Gastroenterology

## 2019-05-16 ENCOUNTER — Ambulatory Visit
Admission: RE | Admit: 2019-05-16 | Discharge: 2019-05-16 | Disposition: A | Discharge status: Home / Self Care | Payer: No Typology Code available for payment source | Attending: Gastroenterology | Admitting: Gastroenterology

## 2019-05-16 DIAGNOSIS — I10 Essential (primary) hypertension: Secondary | ICD-10-CM | POA: Diagnosis not present

## 2019-05-16 DIAGNOSIS — Z1211 Encounter for screening for malignant neoplasm of colon: Secondary | ICD-10-CM | POA: Diagnosis present

## 2019-05-16 HISTORY — PX: COLONOSCOPY WITH PROPOFOL: SHX5780

## 2019-05-16 SURGERY — COLONOSCOPY WITH PROPOFOL
Anesthesia: General | Site: Rectum

## 2019-05-16 MED ORDER — STERILE WATER FOR IRRIGATION IR SOLN
Status: DC | PRN
  Administered 2019-05-16: 50 mL

## 2019-05-16 MED ORDER — PROPOFOL 10 MG/ML IV BOLUS
INTRAVENOUS | Status: DC | PRN
  Administered 2019-05-16 (×2): 40 mg via INTRAVENOUS
  Administered 2019-05-16: 200 mg via INTRAVENOUS

## 2019-05-16 MED ORDER — ACETAMINOPHEN 160 MG/5ML PO SOLN: 325.0000 mg | Freq: Once | ORAL | Status: DC

## 2019-05-16 MED ORDER — LACTATED RINGERS IV SOLN
10.0000 mL/h | INTRAVENOUS | Status: DC
  Administered 2019-05-16: 10 mL/h via INTRAVENOUS

## 2019-05-16 MED ORDER — ACETAMINOPHEN 325 MG PO TABS: 325.0000 mg | ORAL_TABLET | Freq: Once | ORAL | Status: DC

## 2019-05-16 MED ORDER — LIDOCAINE HCL (CARDIAC) PF 100 MG/5ML IV SOSY
PREFILLED_SYRINGE | INTRAVENOUS | Status: DC | PRN
  Administered 2019-05-16: 30 mg via INTRAVENOUS

## 2019-05-16 SURGICAL SUPPLY — 5 items
CANISTER SUCT 1200ML W/VALVE (MISCELLANEOUS) ×2 IMPLANT
GOWN CVR UNV OPN BCK APRN NK (MISCELLANEOUS) ×2 IMPLANT
GOWN ISOL THUMB LOOP REG UNIV (MISCELLANEOUS) ×2
KIT ENDO PROCEDURE OLY (KITS) ×2 IMPLANT
WATER STERILE IRR 250ML POUR (IV SOLUTION) ×2 IMPLANT

## 2019-05-16 NOTE — Transfer of Care (Signed)
Immediate Anesthesia Transfer of Care Note  Patient: Scott Friedman  Procedure(s) Performed: COLONOSCOPY WITH PROPOFOL (N/A Rectum)  Patient Location: PACU  Anesthesia Type: General  Level of Consciousness: awake, alert  and patient cooperative  Airway and Oxygen Therapy: Patient Spontanous Breathing and Patient connected to supplemental oxygen  Post-op Assessment: Post-op Vital signs reviewed, Patient's Cardiovascular Status Stable, Respiratory Function Stable, Patent Airway and No signs of Nausea or vomiting  Post-op Vital Signs: Reviewed and stable  Complications: No apparent anesthesia complications

## 2019-05-16 NOTE — Anesthesia Preprocedure Evaluation (Signed)
Anesthesia Evaluation  Patient identified by MRN, date of birth, ID band Patient awake    Reviewed: Allergy & Precautions, H&P , NPO status , Patient's Chart, lab work & pertinent test results  Airway Mallampati: II  TM Distance: >3 FB Neck ROM: full    Dental no notable dental hx.    Pulmonary    Pulmonary exam normal breath sounds clear to auscultation       Cardiovascular hypertension, Normal cardiovascular exam Rhythm:regular Rate:Normal     Neuro/Psych    GI/Hepatic   Endo/Other    Renal/GU      Musculoskeletal   Abdominal   Peds  Hematology   Anesthesia Other Findings   Reproductive/Obstetrics                             Anesthesia Physical Anesthesia Plan  ASA: II  Anesthesia Plan: General   Post-op Pain Management:    Induction: Intravenous  PONV Risk Score and Plan: 2 and Propofol infusion, Treatment may vary due to age or medical condition and TIVA  Airway Management Planned: Natural Airway  Additional Equipment:   Intra-op Plan:   Post-operative Plan:   Informed Consent: I have reviewed the patients History and Physical, chart, labs and discussed the procedure including the risks, benefits and alternatives for the proposed anesthesia with the patient or authorized representative who has indicated his/her understanding and acceptance.       Plan Discussed with: CRNA  Anesthesia Plan Comments:         Anesthesia Quick Evaluation

## 2019-05-16 NOTE — H&P (Signed)
Scott Lame, MD [facility] [address] [place], [place] [address] Phone: [phone] Fax : [phone]  Primary Care Physician:  Leone Haven, MD Primary Gastroenterologist:  Dr. Allen Norris  Pre-Procedure History & Physical: HPI:  Scott Friedman is a 48 y.o. male is here for a screening colonoscopy.   Past Medical History:  Diagnosis Date  . Hypertension   . Kidney stones   . Migraine     Past Surgical History:  Procedure Laterality Date  . NO PAST SURGERIES      Prior to Admission medications   Medication Sig Start Date End Date Taking? Authorizing Provider  acetaminophen (TYLENOL) 500 MG tablet Take 500 mg by mouth every 6 (six) hours as needed.   Yes [provider]  lisinopril-hydrochlorothiazide (ZESTORETIC) 20-25 MG tablet TAKE 1 TABLET BY MOUTH EVERY DAY 04/04/19  Yes Leone Haven, MD    Allergies as of 04/11/2019  . (No Known Allergies)    Family History  Problem Relation Age of Onset  . Breast cancer Mother   . Hypertension Mother   . Lung cancer Father   . Colon cancer Maternal Uncle   . Stroke Maternal Grandmother   . Prostate cancer Maternal Uncle     Social History   Socioeconomic History  . Marital status: Married    Spouse name: Not on file  . Number of children: Not on file  . Years of education: Not on file  . Highest education level: Not on file  Occupational History  . Not on file  Social Needs  . Financial resource strain: Not on file  . Food insecurity    Worry: Not on file    Inability: Not on file  . Transportation needs    Medical: Not on file    Non-medical: Not on file  Tobacco Use  . Smoking status: Never Smoker  . Smokeless tobacco: Never Used  Substance and Sexual Activity  . Alcohol use: Yes    Alcohol/week: 0.0 - 1.0 standard drinks  . Drug use: No  . Sexual activity: Yes    Partners: Female  Lifestyle  . Physical activity    Days per week: Not on file    Minutes per session: Not on file  .  Stress: Not on file  Relationships  . Social Herbalist on phone: Not on file    Gets together: Not on file    Attends religious service: Not on file    Active member of club or organization: Not on file    Attends meetings of clubs or organizations: Not on file    Relationship status: Not on file  . Intimate partner violence    Fear of current or ex partner: Not on file    Emotionally abused: Not on file    Physically abused: Not on file    Forced sexual activity: Not on file  Other Topics Concern  . Not on file  Social History Narrative  . Not on file    Review of Systems: See HPI, otherwise negative ROS  Physical Exam: BP 124/87   Pulse 68   Temp (!) 97.5 F (36.4 C) (Temporal)   Resp 18   Ht 6\' 1"  (1.854 m)   Wt 93 kg   SpO2 97%   BMI 27.05 kg/m  General:   Alert,  pleasant and cooperative in NAD Head:  Normocephalic and atraumatic. Neck:  Supple; no masses or thyromegaly. Lungs:  Clear throughout to auscultation.  Heart:  Regular rate and rhythm. Abdomen:  Soft, nontender and nondistended. Normal bowel sounds, without guarding, and without rebound.   Neurologic:  Alert and  oriented x4;  grossly normal neurologically.  Impression/Plan: Scott Defreitas is now here to undergo a screening colonoscopy.  Risks, benefits, and alternatives regarding colonoscopy have been reviewed with the patient.  Questions have been answered.  All parties agreeable.

## 2019-05-16 NOTE — Op Note (Signed)
[facility] Patient Name: Scott Friedman Procedure Date: 05/16/2019 9:20 AM MRN: [id number] Account #: [id number] Date of Birth: [birth year] Admit Type: Outpatient Age: 48 Room: [facility] OR ROOM 01 Gender: Male Note Status: Finalized Procedure:             Colonoscopy Indications:           Screening for colorectal malignant neoplasm Providers:             [name] MD, MD Referring MD:          Angela Adam. Caryl Bis (Referring MD) Medicines:             Propofol per Anesthesia Complications:         No immediate complications. Procedure:             Pre-Anesthesia Assessment:                        - Prior to the procedure, a History and Physical was                         performed, and patient medications and allergies were                         reviewed. The patient's tolerance of previous                         anesthesia was also reviewed. The risks and benefits                         of the procedure and the sedation options and risks                         were discussed with the patient. All questions were                         answered, and informed consent was obtained. Prior                         Anticoagulants: The patient has taken no previous                         anticoagulant or antiplatelet agents. ASA Grade                         Assessment: II - A patient with mild systemic disease.                         After reviewing the risks and benefits, the patient                         was deemed in satisfactory condition to undergo the                         procedure.                        After obtaining informed consent, the colonoscope was  passed under direct vision. Throughout the procedure,                         the patient's blood pressure, pulse, and oxygen                         saturations were monitored continuously. The was                         introduced through the anus and  advanced to the the                         cecum, identified by appendiceal orifice and ileocecal                         valve. The colonoscopy was performed without                         difficulty. The patient tolerated the procedure well.                         The quality of the bowel preparation was excellent. Findings:      The perianal and digital rectal examinations were normal.      The colon (entire examined portion) appeared normal. Impression:            - The entire examined colon is normal.                        - No specimens collected. Recommendation:        - Discharge patient to home.                        - Resume previous diet.                        - Continue present medications.                        - Repeat colonoscopy in 10 years for screening unless                         any change in family history or lower GI problems. Procedure Code(s):     --- Professional ---                        [phone], Colonoscopy, flexible; diagnostic, including                         collection of specimen(s) by brushing or washing, when                         performed (separate procedure) Diagnosis Code(s):     --- Professional ---                        Z12.11, Encounter for screening for malignant neoplasm                         of colon CPT copyright 2019 American Medical Association. All rights reserved.  The codes documented in this report are preliminary and upon coder review may  be revised to meet current compliance requirements. [name] MD, MD 05/16/2019 9:42:14 AM This report has been signed electronically. Number of Addenda: 0 Note Initiated On: 05/16/2019 9:20 AM Scope Withdrawal Time: 0 hours 6 minutes 16 seconds  Total Procedure Duration: 0 hours 10 minutes 33 seconds  Estimated Blood Loss:  Estimated blood loss: none.      [facility]

## 2019-05-16 NOTE — Anesthesia Postprocedure Evaluation (Signed)
Anesthesia Post Note  Patient: Scott Friedman  Procedure(s) Performed: COLONOSCOPY WITH PROPOFOL (N/A Rectum)     Patient location during evaluation: PACU Anesthesia Type: General Level of consciousness: awake and alert and oriented Pain management: satisfactory to patient Vital Signs Assessment: post-procedure vital signs reviewed and stable Respiratory status: spontaneous breathing, nonlabored ventilation and respiratory function stable Cardiovascular status: blood pressure returned to baseline and stable Postop Assessment: Adequate PO intake and No signs of nausea or vomiting Anesthetic complications: no    Raliegh Ip

## 2019-09-23 ENCOUNTER — Telehealth: Payer: Self-pay | Admitting: Family Medicine

## 2019-09-23 NOTE — Telephone Encounter (Signed)
Pt has an appt for 6 month follow up on 4/30 and he wants to know if he will be getting lab work done that day. He just needs to know if he can eat before coming in,

## 2019-09-26 ENCOUNTER — Ambulatory Visit: Payer: No Typology Code available for payment source | Admitting: Family Medicine

## 2019-09-26 NOTE — Telephone Encounter (Signed)
I called the patient and informed him that he needed labs done at his appointment.  Lesly Joslyn,cma

## 2019-10-06 ENCOUNTER — Other Ambulatory Visit: Payer: Self-pay | Admitting: Family Medicine

## 2019-10-14 ENCOUNTER — Encounter: Payer: Self-pay | Admitting: Family Medicine

## 2019-10-14 ENCOUNTER — Ambulatory Visit: Payer: No Typology Code available for payment source | Admitting: Family Medicine

## 2019-10-14 ENCOUNTER — Other Ambulatory Visit: Payer: Self-pay

## 2019-10-14 VITALS — BP 118/80 | HR 84 | Temp 97.4°F | Ht 72.0 in | Wt 213.0 lb

## 2019-10-14 DIAGNOSIS — M7022 Olecranon bursitis, left elbow: Secondary | ICD-10-CM

## 2019-10-14 DIAGNOSIS — E7800 Pure hypercholesterolemia, unspecified: Secondary | ICD-10-CM | POA: Diagnosis not present

## 2019-10-14 DIAGNOSIS — Z125 Encounter for screening for malignant neoplasm of prostate: Secondary | ICD-10-CM

## 2019-10-14 DIAGNOSIS — R7303 Prediabetes: Secondary | ICD-10-CM | POA: Diagnosis not present

## 2019-10-14 DIAGNOSIS — I10 Essential (primary) hypertension: Secondary | ICD-10-CM

## 2019-10-14 LAB — COMPREHENSIVE METABOLIC PANEL
ALT: 33 U/L (ref 0–53)
AST: 15 U/L (ref 0–37)
Albumin: 4.2 g/dL (ref 3.5–5.2)
Alkaline Phosphatase: 68 U/L (ref 39–117)
BUN: 19 mg/dL (ref 6–23)
CO2: 31 mEq/L (ref 19–32)
Calcium: 9.3 mg/dL (ref 8.4–10.5)
Chloride: 102 mEq/L (ref 96–112)
Creatinine, Ser: 1 mg/dL (ref 0.40–1.50)
GFR: 78.91 mL/min (ref >60.00)
Glucose, Bld: 114 mg/dL — ABNORMAL HIGH (ref 70–99)
Potassium: 3.8 mEq/L (ref 3.5–5.1)
Sodium: 141 mEq/L (ref 135–145)
Total Bilirubin: 0.6 mg/dL (ref 0.2–1.2)
Total Protein: 6.5 g/dL (ref 6.0–8.3)

## 2019-10-14 LAB — LIPID PANEL
Cholesterol: 174 mg/dL (ref 0–200)
HDL: 30.8 mg/dL — ABNORMAL LOW (ref >39.00)
LDL Cholesterol: 119 mg/dL — ABNORMAL HIGH (ref 0–99)
NonHDL: 143.53
Total CHOL/HDL Ratio: 6
Triglycerides: 125 mg/dL (ref 0.0–149.0)
VLDL: 25 mg/dL (ref 0.0–40.0)

## 2019-10-14 LAB — HEMOGLOBIN A1C: Hgb A1c MFr Bld: 6 % (ref 4.6–6.5)

## 2019-10-14 LAB — PSA: PSA: 0.49 ng/mL (ref 0.10–4.00)

## 2019-10-14 NOTE — Progress Notes (Signed)
  Tommi Rumps, MD Phone: [phone]  Scott Friedman is a 49 y.o. male who presents today for follow-up.  Hypertension: Similar to today at home.  Taking lisinopril and HCTZ.  No chest pain, shortness breath, or edema.  Elevated LDL: Due for lipid panel.  Prediabetes: No polyuria or polydipsia.  Patient notes he eats a little bit of everything.  Lots of grilled chicken and fruits and vegetables.  No junk food.  Does have soda and sweet tea daily.  He has an active job though no specific exercise.  Left olecranon bursitis: Patient notes this has been present for a few weeks.  No injury to the area.  It sore with bumping it though otherwise does not hurt.  No fever.  Social History   Tobacco Use  Smoking Status Never Smoker  Smokeless Tobacco Never Used     ROS see history of present illness  Objective  Physical Exam Vitals:   10/14/19 0928  BP: 118/80  Pulse: 84  Temp: (!) 97.4 F (36.3 C)  SpO2: 96%    BP Readings from Last 3 Encounters:  10/14/19 118/80  05/16/19 (!) 139/93  03/28/19 118/70   Wt Readings from Last 3 Encounters:  10/14/19 213 lb (96.6 kg)  05/16/19 205 lb (93 kg)  03/28/19 212 lb 12.8 oz (96.5 kg)    Physical Exam Constitutional:      General: He is not in acute distress.    Appearance: He is not diaphoretic.  Cardiovascular:     Rate and Rhythm: Normal rate and regular rhythm.     Heart sounds: Normal heart sounds.  Pulmonary:     Effort: Pulmonary effort is normal.     Breath sounds: Normal breath sounds.  Musculoskeletal:     Right lower leg: No edema.     Left lower leg: No edema.     Comments: Left olecranon bursitis noted, no warmth, erythema, or tenderness  Skin:    General: Skin is warm and dry.  Neurological:     Mental Status: He is alert.      Assessment/Plan: Please see individual problem list.  Essential hypertension At goal.  Check labs.  Continue current medication.  Olecranon bursitis of left elbow Refer to  orthopedics.  Counseled on signs of infection and to seek medical attention if they occurred.  Prostate cancer screening No family history.  Check PSA.  Prediabetes Check A1c.  Counseled on diet and exercise.  Elevated LDL cholesterol level Check lipid panel.  Counseled on diet and exercise.   Orders Placed This Encounter  Procedures  . Lipid panel  . Comp Met (CMET)  . HgB A1c  . PSA  . Ambulatory referral to Orthopedic Surgery    Referral Priority:   Routine    Referral Type:   Surgical    Referral Reason:   Specialty Services Required    Requested Specialty:   Orthopedic Surgery    Number of Visits Requested:   1    No orders of the defined types were placed in this encounter.   This visit occurred during the SARS-CoV-2 public health emergency.  Safety protocols were in place, including screening questions prior to the visit, additional usage of staff PPE, and extensive cleaning of exam room while observing appropriate contact time as indicated for disinfecting solutions.    Tommi Rumps, MD [place]

## 2019-10-14 NOTE — Assessment & Plan Note (Signed)
At goal.  Check labs.  Continue current medication.

## 2019-10-14 NOTE — Assessment & Plan Note (Signed)
Refer to orthopedics.  Counseled on signs of infection and to seek medical attention if they occurred.

## 2019-10-14 NOTE — Assessment & Plan Note (Signed)
No family history.  Check PSA.

## 2019-10-14 NOTE — Assessment & Plan Note (Signed)
Check lipid panel.  Counseled on diet and exercise.

## 2019-10-14 NOTE — Patient Instructions (Signed)
Nice to see you. We will check labs today. I have referred you to orthopedics. If you develop increasing pain, warmth, redness, or fevers related to your left elbow please seek medical attention immediately.

## 2019-10-14 NOTE — Assessment & Plan Note (Signed)
Check A1c.  Counseled on diet and exercise.

## 2020-01-06 ENCOUNTER — Ambulatory Visit: Payer: No Typology Code available for payment source | Attending: Internal Medicine

## 2020-01-06 DIAGNOSIS — Z23 Encounter for immunization: Secondary | ICD-10-CM

## 2020-01-06 NOTE — Progress Notes (Signed)
   Covid-19 Vaccination Clinic  Name:  Scott Friedman    MRN: [id number] DOB: [birth year]  01/06/2020  Scott Friedman was observed post Covid-19 immunization for 15 minutes without incident. He was provided with Vaccine Information Sheet and instruction to access the V-Safe system.   Scott Friedman was instructed to call 911 with any severe reactions post vaccine: Marland Kitchen Difficulty breathing  . Swelling of face and throat  . A fast heartbeat  . A bad rash all over body  . Dizziness and weakness   Immunizations Administered    Name Date Dose VIS Date Route   Pfizer COVID-19 Vaccine 01/06/2020  9:49 AM 0.3 mL 08/10/2018 Intramuscular   Manufacturer: [place]   Lot: [id number]   [place]: [id number]

## 2020-01-23 ENCOUNTER — Ambulatory Visit: Payer: No Typology Code available for payment source

## 2020-04-16 ENCOUNTER — Encounter: Payer: Self-pay | Admitting: Family Medicine

## 2020-04-16 ENCOUNTER — Other Ambulatory Visit: Payer: Self-pay

## 2020-04-16 ENCOUNTER — Ambulatory Visit (INDEPENDENT_AMBULATORY_CARE_PROVIDER_SITE_OTHER): Payer: No Typology Code available for payment source | Admitting: Family Medicine

## 2020-04-16 VITALS — BP 120/80 | HR 67 | Temp 98.2°F | Ht 72.0 in | Wt 216.0 lb

## 2020-04-16 DIAGNOSIS — I10 Essential (primary) hypertension: Secondary | ICD-10-CM | POA: Diagnosis not present

## 2020-04-16 DIAGNOSIS — E7800 Pure hypercholesterolemia, unspecified: Secondary | ICD-10-CM | POA: Diagnosis not present

## 2020-04-16 DIAGNOSIS — R7303 Prediabetes: Secondary | ICD-10-CM | POA: Diagnosis not present

## 2020-04-16 DIAGNOSIS — Z0000 Encounter for general adult medical examination without abnormal findings: Secondary | ICD-10-CM | POA: Diagnosis not present

## 2020-04-16 DIAGNOSIS — Z0001 Encounter for general adult medical examination with abnormal findings: Secondary | ICD-10-CM | POA: Insufficient documentation

## 2020-04-16 LAB — HEMOGLOBIN A1C: Hgb A1c MFr Bld: 6.2 % (ref 4.6–6.5)

## 2020-04-16 LAB — COMPREHENSIVE METABOLIC PANEL
ALT: 30 U/L (ref 0–53)
AST: 19 U/L (ref 0–37)
Albumin: 4.4 g/dL (ref 3.5–5.2)
Alkaline Phosphatase: 55 U/L (ref 39–117)
BUN: 17 mg/dL (ref 6–23)
CO2: 31 mEq/L (ref 19–32)
Calcium: 9.5 mg/dL (ref 8.4–10.5)
Chloride: 103 mEq/L (ref 96–112)
Creatinine, Ser: 0.98 mg/dL (ref 0.40–1.50)
GFR: 89.55 mL/min (ref >60.00)
Glucose, Bld: 119 mg/dL — ABNORMAL HIGH (ref 70–99)
Potassium: 4 mEq/L (ref 3.5–5.1)
Sodium: 141 mEq/L (ref 135–145)
Total Bilirubin: 0.6 mg/dL (ref 0.2–1.2)
Total Protein: 6.8 g/dL (ref 6.0–8.3)

## 2020-04-16 LAB — LDL CHOLESTEROL, DIRECT: Direct LDL: 127 mg/dL

## 2020-04-16 NOTE — Assessment & Plan Note (Signed)
Physical exam completed.  Continue healthy diet and activity.  Colonoscopy up-to-date.  Prostate cancer screening up-to-date.  I encouraged him to get the Shingrix vaccine.  He declines a flu vaccine.  Other vaccines up-to-date.  Lab work as outlined.

## 2020-04-16 NOTE — Progress Notes (Signed)
Tommi Rumps, MD Phone: [phone]  Scott Friedman is a 49 y.o. male who presents today for CPE.  Diet: Generally very healthy.  Eats grilled meats and broiled fish.  Does get fruits and vegetables daily.  Does have some sugary drinks each day.  Drinking more. Exercise: His job is very active Colonoscopy: 05/16/2019 with 10-year recall.  No polyps found Prostate cancer screening: Up-to-date Family history-  Prostate cancer: No  Colon cancer: 2 uncles on his mom's side Vaccines-   Flu: Declines  Tetanus: Up-to-date  Shingles: Defers  COVID19: Up-to-date HIV screening: Up-to-date Hep C Screening: Up-to-date-screen through blood donation about 2 years ago Tobacco use: no Alcohol use: rare Illicit Drug use: no Dentist: yes Ophthalmology: yes   Active Ambulatory Problems    Diagnosis Date Noted  . Essential hypertension 05/12/2016  . Prediabetes 07/31/2017  . Elevated LDL cholesterol level 10/28/2017  . Colon cancer screening 03/28/2019  . Prostate cancer screening 10/14/2019  . Olecranon bursitis of left elbow 10/14/2019  . Routine general medical examination at a health care facility 04/16/2020   Resolved Ambulatory Problems    Diagnosis Date Noted  . Annual physical exam 05/12/2016   Past Medical History:  Diagnosis Date  . Hypertension   . Kidney stones   . Migraine     Family History  Problem Relation Age of Onset  . Breast cancer Mother   . Hypertension Mother   . Lung cancer Father   . Colon cancer Maternal Uncle   . Stroke Maternal Grandmother   . Prostate cancer Maternal Uncle     Social History   Socioeconomic History  . Marital status: Married    Spouse name: Not on file  . Number of children: Not on file  . Years of education: Not on file  . Highest education level: Not on file  Occupational History  . Not on file  Tobacco Use  . Smoking status: Never Smoker  . Smokeless tobacco: Never Used  Vaping Use  . Vaping Use: Never used   Substance and Sexual Activity  . Alcohol use: Yes    Alcohol/week: 0.0 - 1.0 standard drinks  . Drug use: No  . Sexual activity: Yes    Partners: Female  Other Topics Concern  . Not on file  Social History Narrative  . Not on file   Social Determinants of Health   Financial Resource Strain:   . Difficulty of Paying Living Expenses: Not on file  Food Insecurity:   . Worried About Charity fundraiser in the Last Year: Not on file  . Ran Out of Food in the Last Year: Not on file  Transportation Needs:   . Lack of Transportation (Medical): Not on file  . Lack of Transportation (Non-Medical): Not on file  Physical Activity:   . Days of Exercise per Week: Not on file  . Minutes of Exercise per Session: Not on file  Stress:   . Feeling of Stress : Not on file  Social Connections:   . Frequency of Communication with Friends and Family: Not on file  . Frequency of Social Gatherings with Friends and Family: Not on file  . Attends Religious Services: Not on file  . Active Member of Clubs or Organizations: Not on file  . Attends Archivist Meetings: Not on file  . Marital Status: Not on file  Intimate Partner Violence:   . Fear of Current or Ex-Partner: Not on file  . Emotionally Abused: Not on file  .  Physically Abused: Not on file  . Sexually Abused: Not on file    ROS  General:  Negative for nexplained weight loss, fever Skin: Negative for new or changing mole, sore that won't heal HEENT: Negative for trouble hearing, trouble seeing, ringing in ears, mouth sores, hoarseness, change in voice, dysphagia. CV:  Negative for chest pain, dyspnea, edema, palpitations Resp: Negative for cough, dyspnea, hemoptysis GI: Negative for nausea, vomiting, diarrhea, constipation, abdominal pain, melena, hematochezia. GU: Negative for dysuria, incontinence, urinary hesitance, hematuria, vaginal or penile discharge, polyuria, sexual difficulty, lumps in testicle or breasts MSK:  Negative for muscle cramps or aches, joint pain or swelling Neuro: Negative for headaches, weakness, numbness, dizziness, passing out/fainting Psych: Negative for depression, anxiety, memory problems  Objective  Physical Exam Vitals:   04/16/20 0831  BP: 120/80  Pulse: 67  Temp: 98.2 F (36.8 C)  SpO2: 95%    BP Readings from Last 3 Encounters:  04/16/20 120/80  10/14/19 118/80  05/16/19 (!) 139/93   Wt Readings from Last 3 Encounters:  04/16/20 216 lb (98 kg)  10/14/19 213 lb (96.6 kg)  05/16/19 205 lb (93 kg)    Physical Exam Constitutional:      General: He is not in acute distress.    Appearance: He is not diaphoretic.  HENT:     Head: Normocephalic and atraumatic.  Eyes:     Conjunctiva/sclera: Conjunctivae normal.     Pupils: Pupils are equal, round, and reactive to light.  Cardiovascular:     Rate and Rhythm: Normal rate and regular rhythm.     Heart sounds: Normal heart sounds.  Pulmonary:     Effort: Pulmonary effort is normal.     Breath sounds: Normal breath sounds.  Abdominal:     General: Bowel sounds are normal. There is no distension.     Palpations: Abdomen is soft.     Tenderness: There is no abdominal tenderness. There is no guarding or rebound.  Musculoskeletal:     Right lower leg: No edema.     Left lower leg: No edema.  Lymphadenopathy:     Cervical: No cervical adenopathy.  Skin:    General: Skin is warm and dry.  Neurological:     Mental Status: He is alert.  Psychiatric:        Mood and Affect: Mood normal.      Assessment/Plan:   Problem List Items Addressed This Visit    Elevated LDL cholesterol level   Relevant Orders   Direct LDL   Essential hypertension   Relevant Orders   Comp Met (CMET)   Prediabetes   Relevant Orders   HgB A1c   Routine general medical examination at a health care facility - Primary    Physical exam completed.  Continue healthy diet and activity.  Colonoscopy up-to-date.  Prostate cancer  screening up-to-date.  I encouraged him to get the Shingrix vaccine.  He declines a flu vaccine.  Other vaccines up-to-date.  Lab work as outlined.         This visit occurred during the SARS-CoV-2 public health emergency.  Safety protocols were in place, including screening questions prior to the visit, additional usage of staff PPE, and extensive cleaning of exam room while observing appropriate contact time as indicated for disinfecting solutions.    Tommi Rumps, MD [place]

## 2020-04-16 NOTE — Patient Instructions (Addendum)
Nice to see you.  Please stay active and continue to work on your diet.  We will get labs today and contact you with the results.  Please think about getting the shingles vaccine.

## 2020-04-22 ENCOUNTER — Other Ambulatory Visit: Payer: Self-pay | Admitting: Family Medicine

## 2020-10-16 ENCOUNTER — Encounter: Payer: Self-pay | Admitting: Family Medicine

## 2020-10-16 ENCOUNTER — Ambulatory Visit: Payer: No Typology Code available for payment source | Admitting: Family Medicine

## 2020-10-16 ENCOUNTER — Other Ambulatory Visit: Payer: Self-pay

## 2020-10-16 VITALS — BP 120/80 | HR 84 | Temp 97.9°F | Wt 214.4 lb

## 2020-10-16 DIAGNOSIS — R7303 Prediabetes: Secondary | ICD-10-CM

## 2020-10-16 DIAGNOSIS — I10 Essential (primary) hypertension: Secondary | ICD-10-CM

## 2020-10-16 DIAGNOSIS — Z125 Encounter for screening for malignant neoplasm of prostate: Secondary | ICD-10-CM | POA: Diagnosis not present

## 2020-10-16 DIAGNOSIS — E7800 Pure hypercholesterolemia, unspecified: Secondary | ICD-10-CM | POA: Diagnosis not present

## 2020-10-16 LAB — COMPREHENSIVE METABOLIC PANEL
ALT: 33 U/L (ref 0–53)
AST: 16 U/L (ref 0–37)
Albumin: 4.1 g/dL (ref 3.5–5.2)
Alkaline Phosphatase: 56 U/L (ref 39–117)
BUN: 14 mg/dL (ref 6–23)
CO2: 29 mEq/L (ref 19–32)
Calcium: 9.1 mg/dL (ref 8.4–10.5)
Chloride: 101 mEq/L (ref 96–112)
Creatinine, Ser: 0.99 mg/dL (ref 0.40–1.50)
GFR: 88.16 mL/min (ref >60.00)
Glucose, Bld: 168 mg/dL — ABNORMAL HIGH (ref 70–99)
Potassium: 3.4 mEq/L — ABNORMAL LOW (ref 3.5–5.1)
Sodium: 140 mEq/L (ref 135–145)
Total Bilirubin: 0.7 mg/dL (ref 0.2–1.2)
Total Protein: 6.3 g/dL (ref 6.0–8.3)

## 2020-10-16 LAB — HEMOGLOBIN A1C: Hgb A1c MFr Bld: 6.1 % (ref 4.6–6.5)

## 2020-10-16 LAB — LIPID PANEL
Cholesterol: 173 mg/dL (ref 0–200)
HDL: 32.8 mg/dL — ABNORMAL LOW (ref >39.00)
LDL Cholesterol: 105 mg/dL — ABNORMAL HIGH (ref 0–99)
NonHDL: 140.06
Total CHOL/HDL Ratio: 5
Triglycerides: 177 mg/dL — ABNORMAL HIGH (ref 0.0–149.0)
VLDL: 35.4 mg/dL (ref 0.0–40.0)

## 2020-10-16 LAB — PSA: PSA: 0.45 ng/mL (ref 0.10–4.00)

## 2020-10-16 NOTE — Assessment & Plan Note (Signed)
Adequate control.  Continue current dose of lisinopril/HCTZ 1 tablet once daily.  Check labs.

## 2020-10-16 NOTE — Assessment & Plan Note (Signed)
Check PSA. ?

## 2020-10-16 NOTE — Assessment & Plan Note (Signed)
Check A1c.  Encouraged healthy diet.  Discussed decreasing soda and sweet tea intake.

## 2020-10-16 NOTE — Patient Instructions (Signed)
Nice to see you. We will check lab work today. Please do the exercises for your back to see if this will help prevent future back spasms.   Back Exercises These exercises help to make your trunk and back strong. They also help to keep the lower back flexible. Doing these exercises can help to prevent back pain or lessen existing pain.  If you have back pain, try to do these exercises 2-3 times each day or as told by your doctor.  As you get better, do the exercises once each day. Repeat the exercises more often as told by your doctor.  To stop back pain from coming back, do the exercises once each day, or as told by your doctor. Exercises Single knee to chest Do these steps 3-5 times in a row for each leg: 1. Lie on your back on a firm bed or the floor with your legs stretched out. 2. Bring one knee to your chest. 3. Grab your knee or thigh with both hands and hold them it in place. 4. Pull on your knee until you feel a gentle stretch in your lower back or buttocks. 5. Keep doing the stretch for 10-30 seconds. 6. Slowly let go of your leg and straighten it. Pelvic tilt Do these steps 5-10 times in a row: 1. Lie on your back on a firm bed or the floor with your legs stretched out. 2. Bend your knees so they point up to the ceiling. Your feet should be flat on the floor. 3. Tighten your lower belly (abdomen) muscles to press your lower back against the floor. This will make your tailbone point up to the ceiling instead of pointing down to your feet or the floor. 4. Stay in this position for 5-10 seconds while you gently tighten your muscles and breathe evenly. Cat-cow Do these steps until your lower back bends more easily: 1. Get on your hands and knees on a firm surface. Keep your hands under your shoulders, and keep your knees under your hips. You may put padding under your knees. 2. Let your head hang down toward your chest. Tighten (contract) the muscles in your belly. Point your  tailbone toward the floor so your lower back becomes rounded like the back of a cat. 3. Stay in this position for 5 seconds. 4. Slowly lift your head. Let the muscles of your belly relax. Point your tailbone up toward the ceiling so your back forms a sagging arch like the back of a cow. 5. Stay in this position for 5 seconds.   Press-ups Do these steps 5-10 times in a row: 1. Lie on your belly (face-down) on the floor. 2. Place your hands near your head, about shoulder-width apart. 3. While you keep your back relaxed and keep your hips on the floor, slowly straighten your arms to raise the top half of your body and lift your shoulders. Do not use your back muscles. You may change where you place your hands in order to make yourself more comfortable. 4. Stay in this position for 5 seconds. 5. Slowly return to lying flat on the floor.   Bridges Do these steps 10 times in a row: 1. Lie on your back on a firm surface. 2. Bend your knees so they point up to the ceiling. Your feet should be flat on the floor. Your arms should be flat at your sides, next to your body. 3. Tighten your butt muscles and lift your butt off the floor until  your waist is almost as high as your knees. If you do not feel the muscles working in your butt and the back of your thighs, slide your feet 1-2 inches farther away from your butt. 4. Stay in this position for 3-5 seconds. 5. Slowly lower your butt to the floor, and let your butt muscles relax. If this exercise is too easy, try doing it with your arms crossed over your chest.   Belly crunches Do these steps 5-10 times in a row: 1. Lie on your back on a firm bed or the floor with your legs stretched out. 2. Bend your knees so they point up to the ceiling. Your feet should be flat on the floor. 3. Cross your arms over your chest. 4. Tip your chin a little bit toward your chest but do not bend your neck. 5. Tighten your belly muscles and slowly raise your chest just  enough to lift your shoulder blades a tiny bit off of the floor. Avoid raising your body higher than that, because it can put too much stress on your low back. 6. Slowly lower your chest and your head to the floor. Back lifts Do these steps 5-10 times in a row: 1. Lie on your belly (face-down) with your arms at your sides, and rest your forehead on the floor. 2. Tighten the muscles in your legs and your butt. 3. Slowly lift your chest off of the floor while you keep your hips on the floor. Keep the back of your head in line with the curve in your back. Look at the floor while you do this. 4. Stay in this position for 3-5 seconds. 5. Slowly lower your chest and your face to the floor. Contact a doctor if:  Your back pain gets a lot worse when you do an exercise.  Your back pain does not get better 2 hours after you exercise. If you have any of these problems, stop doing the exercises. Do not do them again unless your doctor says it is okay. Get help right away if:  You have sudden, very bad back pain. If this happens, stop doing the exercises. Do not do them again unless your doctor says it is okay. This information is not intended to replace advice given to you by your health care provider. Make sure you discuss any questions you have with your health care provider. Document Revised: 02/25/2018 Document Reviewed: 02/25/2018 Elsevier Patient Education  2021 [employer].

## 2020-10-16 NOTE — Assessment & Plan Note (Signed)
Check lipid panel.  Encouraged healthy diet.

## 2020-10-16 NOTE — Progress Notes (Signed)
Tommi Rumps, MD Phone: [phone]  Scott Friedman is a 50 y.o. male who presents today for f/u.  HYPERTENSION  Disease Monitoring  Home BP Monitoring 117/78 Chest pain- no    Dyspnea- no Medications  Compliance-  Taking lisinopril/HCTZ.   Edema- no  Prediabetes: No polyuria or polydipsia.  Not doing much exercise though his job is quite active.  He eats when he is hungry.  He is eating mostly grilled foods.  Lots of fruits and vegetables.  He does drink 2-3 sodas or sweet teas per day.  Low back pain: Patient notes this occurred last week.  He thinks he stepped wrong getting out of the truck.  Hurt in his right low back.  No radiation, numbness, weakness, or incontinence.  He used ibuprofen and Tylenol.  His discomfort has resolved at this time.   Social History   Tobacco Use  Smoking Status Never Smoker  Smokeless Tobacco Never Used    Current Outpatient Medications on File Prior to Visit  Medication Sig Dispense Refill  . acetaminophen (TYLENOL) 500 MG tablet Take 500 mg by mouth every 6 (six) hours as needed.    Marland Kitchen lisinopril-hydrochlorothiazide (ZESTORETIC) 20-25 MG tablet TAKE 1 TABLET BY MOUTH EVERY DAY 30 tablet 5   No current facility-administered medications on file prior to visit.     ROS see history of present illness  Objective  Physical Exam Vitals:   10/16/20 0808  BP: 120/80  Pulse: 84  Temp: 97.9 F (36.6 C)  SpO2: 99%    BP Readings from Last 3 Encounters:  10/16/20 120/80  04/16/20 120/80  10/14/19 118/80   Wt Readings from Last 3 Encounters:  10/16/20 214 lb 6.4 oz (97.3 kg)  04/16/20 216 lb (98 kg)  10/14/19 213 lb (96.6 kg)    Physical Exam Constitutional:      General: He is not in acute distress.    Appearance: He is not diaphoretic.  Cardiovascular:     Rate and Rhythm: Normal rate and regular rhythm.     Heart sounds: Normal heart sounds.  Pulmonary:     Effort: Pulmonary effort is normal.     Breath sounds: Normal breath  sounds.  Musculoskeletal:     Right lower leg: No edema.     Left lower leg: No edema.  Skin:    General: Skin is warm and dry.  Neurological:     Mental Status: He is alert.     Comments: 5/5 strength bilateral quads, hamstrings, plantar flexion, and dorsiflexion, sensation light touch intact bilateral lower extremities      Assessment/Plan: Please see individual problem list.  Problem List Items Addressed This Visit    Essential hypertension - Primary    Adequate control.  Continue current dose of lisinopril/HCTZ 1 tablet once daily.  Check labs.      Relevant Orders   Comp Met (CMET)   Prediabetes    Check A1c.  Encouraged healthy diet.  Discussed decreasing soda and sweet tea intake.      Relevant Orders   HgB A1c   Elevated LDL cholesterol level    Check lipid panel.  Encouraged healthy diet.      Relevant Orders   Lipid panel   Comp Met (CMET)   Prostate cancer screening    Check PSA.      Relevant Orders   PSA        This visit occurred during the SARS-CoV-2 public health emergency.  Safety protocols were in place, including  screening questions prior to the visit, additional usage of staff PPE, and extensive cleaning of exam room while observing appropriate contact time as indicated for disinfecting solutions.    Tommi Rumps, MD [place]

## 2020-11-02 ENCOUNTER — Other Ambulatory Visit: Payer: Self-pay | Admitting: Family Medicine

## 2021-04-22 ENCOUNTER — Encounter: Payer: Self-pay | Admitting: Family Medicine

## 2021-04-22 ENCOUNTER — Ambulatory Visit (INDEPENDENT_AMBULATORY_CARE_PROVIDER_SITE_OTHER): Payer: No Typology Code available for payment source | Admitting: Family Medicine

## 2021-04-22 ENCOUNTER — Other Ambulatory Visit: Payer: Self-pay

## 2021-04-22 VITALS — BP 130/78 | HR 64 | Temp 98.6°F | Ht 72.0 in | Wt 217.4 lb

## 2021-04-22 DIAGNOSIS — Z0000 Encounter for general adult medical examination without abnormal findings: Secondary | ICD-10-CM

## 2021-04-22 DIAGNOSIS — R7303 Prediabetes: Secondary | ICD-10-CM

## 2021-04-22 DIAGNOSIS — I10 Essential (primary) hypertension: Secondary | ICD-10-CM

## 2021-04-22 DIAGNOSIS — E7800 Pure hypercholesterolemia, unspecified: Secondary | ICD-10-CM

## 2021-04-22 LAB — LDL CHOLESTEROL, DIRECT: Direct LDL: 129 mg/dL

## 2021-04-22 LAB — BASIC METABOLIC PANEL
BUN: 17 mg/dL (ref 6–23)
CO2: 31 mEq/L (ref 19–32)
Calcium: 9.1 mg/dL (ref 8.4–10.5)
Chloride: 103 mEq/L (ref 96–112)
Creatinine, Ser: 1.05 mg/dL (ref 0.40–1.50)
GFR: 81.85 mL/min (ref >60.00)
Glucose, Bld: 117 mg/dL — ABNORMAL HIGH (ref 70–99)
Potassium: 3.4 mEq/L — ABNORMAL LOW (ref 3.5–5.1)
Sodium: 139 mEq/L (ref 135–145)

## 2021-04-22 LAB — HEMOGLOBIN A1C: Hgb A1c MFr Bld: 6.2 % (ref 4.6–6.5)

## 2021-04-22 NOTE — Assessment & Plan Note (Signed)
Physical exam completed.  I encouraged healthy diet.  Discussed decreasing sweet tea intake.  He will continue with his active job.  Prostate cancer screening and colon cancer screening are up-to-date.  I encouraged him to get the COVID booster and Shingrix vaccines.  He notes he will consider those though declines a flu vaccine.  Lab work as outlined.

## 2021-04-22 NOTE — Progress Notes (Signed)
Tommi Rumps, MD Phone: [phone]  Scott Friedman is a 50 y.o. male who presents today for CPE.  Diet: generally healthy, mostly grilled meats, not much red meat, plenty of fruits and vegetables, does drink 2-3 sweet teas daily Exercise: active job Colonoscopy: 05/15/21 negative for polyps Prostate cancer screening: UTD Family history-  Prostate cancer: no  Colon cancer: 2 maternal uncles, no first degree relatives Vaccines-   Flu: declines  Tetanus: UTD  Shingles: defers  COVID19: x2, considering the booster HIV screening: UTD Hep C Screening: declined in the past Tobacco use: no Alcohol use: rare Illicit Drug use: no Dentist: yes Ophthalmology: periodically   Active Ambulatory Problems    Diagnosis Date Noted   Essential hypertension 05/12/2016   Prediabetes 07/31/2017   Elevated LDL cholesterol level 10/28/2017   Colon cancer screening 03/28/2019   Prostate cancer screening 10/14/2019   Olecranon bursitis of left elbow 10/14/2019   Routine general medical examination at a health care facility 04/16/2020   Resolved Ambulatory Problems    Diagnosis Date Noted   Annual physical exam 05/12/2016   Past Medical History:  Diagnosis Date   Hypertension    Kidney stones    Migraine     Family History  Problem Relation Age of Onset   Breast cancer Mother    Hypertension Mother    Lung cancer Father    Colon cancer Maternal Uncle    Stroke Maternal Grandmother    Prostate cancer Maternal Uncle     Social History   Socioeconomic History   Marital status: Married    Spouse name: Not on file   Number of children: Not on file   Years of education: Not on file   Highest education level: Not on file  Occupational History   Not on file  Tobacco Use   Smoking status: Never   Smokeless tobacco: Never  Vaping Use   Vaping Use: Never used  Substance and Sexual Activity   Alcohol use: Yes    Alcohol/week: 0.0 - 1.0 standard drinks   Drug use: No   Sexual  activity: Yes    Partners: Female  Other Topics Concern   Not on file  Social History Narrative   Not on file   Social Determinants of Health   Financial Resource Strain: Not on file  Food Insecurity: Not on file  Transportation Needs: Not on file  Physical Activity: Not on file  Stress: Not on file  Social Connections: Not on file  Intimate Partner Violence: Not on file    ROS  General:  Negative for nexplained weight loss, fever Skin: Negative for new or changing mole, sore that won't heal HEENT: Negative for trouble hearing, trouble seeing, ringing in ears, mouth sores, hoarseness, change in voice, dysphagia. CV:  Negative for chest pain, dyspnea, edema, palpitations Resp: Negative for cough, dyspnea, hemoptysis GI: Negative for nausea, vomiting, diarrhea, constipation, abdominal pain, melena, hematochezia. GU: Negative for dysuria, incontinence, urinary hesitance, hematuria, vaginal or penile discharge, polyuria, sexual difficulty, lumps in testicle or breasts MSK: Negative for muscle cramps or aches, joint pain or swelling Neuro: Negative for headaches, weakness, numbness, dizziness, passing out/fainting Psych: Negative for depression, anxiety, memory problems  Objective  Physical Exam Vitals:   04/22/21 0801  BP: 130/78  Pulse: 64  Temp: 98.6 F (37 C)  SpO2: 96%    BP Readings from Last 3 Encounters:  04/22/21 130/78  10/16/20 120/80  04/16/20 120/80   Wt Readings from Last 3 Encounters:  04/22/21  217 lb 6.4 oz (98.6 kg)  10/16/20 214 lb 6.4 oz (97.3 kg)  04/16/20 216 lb (98 kg)    Physical Exam Constitutional:      General: He is not in acute distress.    Appearance: He is not diaphoretic.  HENT:     Head: Normocephalic and atraumatic.  Eyes:     Conjunctiva/sclera: Conjunctivae normal.     Pupils: Pupils are equal, round, and reactive to light.  Cardiovascular:     Rate and Rhythm: Normal rate and regular rhythm.     Heart sounds: Normal heart  sounds.  Pulmonary:     Effort: Pulmonary effort is normal.     Breath sounds: Normal breath sounds.  Abdominal:     General: Bowel sounds are normal. There is no distension.     Palpations: Abdomen is soft.  Musculoskeletal:     Right lower leg: No edema.     Left lower leg: No edema.  Lymphadenopathy:     Cervical: No cervical adenopathy.  Skin:    General: Skin is warm and dry.  Neurological:     Mental Status: He is alert.  Psychiatric:        Mood and Affect: Mood normal.     Assessment/Plan:   Problem List Items Addressed This Visit     Elevated LDL cholesterol level   Relevant Orders   Direct LDL   Essential hypertension   Relevant Orders   Basic Metabolic Panel (BMET)   Prediabetes   Relevant Orders   HgB A1c   Routine general medical examination at a health care facility - Primary    Physical exam completed.  I encouraged healthy diet.  Discussed decreasing sweet tea intake.  He will continue with his active job.  Prostate cancer screening and colon cancer screening are up-to-date.  I encouraged him to get the COVID booster and Shingrix vaccines.  He notes he will consider those though declines a flu vaccine.  Lab work as outlined.       Return in about 6 months (around 10/20/2021) for Hypertension.  This visit occurred during the SARS-CoV-2 public health emergency.  Safety protocols were in place, including screening questions prior to the visit, additional usage of staff PPE, and extensive cleaning of exam room while observing appropriate contact time as indicated for disinfecting solutions.    Tommi Rumps, MD [place]

## 2021-04-22 NOTE — Patient Instructions (Signed)
Nice to see. Please try to cut down on the sweet tea. Please continue with your healthy diet and activity level. We will get lab work today. If you would like the Shingrix vaccine please let us know.  You should go ahead and get the COVID booster.

## 2021-05-01 NOTE — Progress Notes (Signed)
After 3 attempts to reach the patient by phone a letter was mailed to his address with results and a statement from the provider.  Andromeda Poppen,cma

## 2021-05-13 ENCOUNTER — Other Ambulatory Visit: Payer: Self-pay | Admitting: Family Medicine

## 2021-10-21 ENCOUNTER — Encounter: Payer: Self-pay | Admitting: Family Medicine

## 2021-10-21 ENCOUNTER — Ambulatory Visit (INDEPENDENT_AMBULATORY_CARE_PROVIDER_SITE_OTHER): Payer: BC Managed Care – PPO | Admitting: Family Medicine

## 2021-10-21 DIAGNOSIS — Z125 Encounter for screening for malignant neoplasm of prostate: Secondary | ICD-10-CM | POA: Diagnosis not present

## 2021-10-21 DIAGNOSIS — E7800 Pure hypercholesterolemia, unspecified: Secondary | ICD-10-CM | POA: Diagnosis not present

## 2021-10-21 DIAGNOSIS — R7303 Prediabetes: Secondary | ICD-10-CM

## 2021-10-21 DIAGNOSIS — I10 Essential (primary) hypertension: Secondary | ICD-10-CM | POA: Diagnosis not present

## 2021-10-21 LAB — COMPREHENSIVE METABOLIC PANEL
ALT: 28 U/L (ref 0–53)
AST: 16 U/L (ref 0–37)
Albumin: 4.2 g/dL (ref 3.5–5.2)
Alkaline Phosphatase: 53 U/L (ref 39–117)
BUN: 19 mg/dL (ref 6–23)
CO2: 30 mEq/L (ref 19–32)
Calcium: 9 mg/dL (ref 8.4–10.5)
Chloride: 101 mEq/L (ref 96–112)
Creatinine, Ser: 0.96 mg/dL (ref 0.40–1.50)
GFR: 90.82 mL/min (ref >60.00)
Glucose, Bld: 120 mg/dL — ABNORMAL HIGH (ref 70–99)
Potassium: 3.9 mEq/L (ref 3.5–5.1)
Sodium: 140 mEq/L (ref 135–145)
Total Bilirubin: 0.6 mg/dL (ref 0.2–1.2)
Total Protein: 6.7 g/dL (ref 6.0–8.3)

## 2021-10-21 LAB — LIPID PANEL
Cholesterol: 200 mg/dL (ref 0–200)
HDL: 34.6 mg/dL — ABNORMAL LOW (ref >39.00)
LDL Cholesterol: 129 mg/dL — ABNORMAL HIGH (ref 0–99)
NonHDL: 165.18
Total CHOL/HDL Ratio: 6
Triglycerides: 179 mg/dL — ABNORMAL HIGH (ref 0.0–149.0)
VLDL: 35.8 mg/dL (ref 0.0–40.0)

## 2021-10-21 LAB — PSA: PSA: 0.4 ng/mL (ref 0.10–4.00)

## 2021-10-21 LAB — HEMOGLOBIN A1C: Hgb A1c MFr Bld: 6.2 % (ref 4.6–6.5)

## 2021-10-21 NOTE — Assessment & Plan Note (Signed)
Encouraged reduction in sweets.  He will continue to remain active with his job. ?

## 2021-10-21 NOTE — Assessment & Plan Note (Signed)
Well-controlled.  Check lab work today.  Continue 1 tablet lisinopril-HCTZ daily. ?

## 2021-10-21 NOTE — Progress Notes (Signed)
?  Tommi Rumps, MD ?Phone: [phone] ? ?Scott Friedman is a 51 y.o. male who presents today for f/u. ? ?HYPERTENSION ?Disease Monitoring ?Home BP Monitoring <120/80  Chest pain- no    Dyspnea- n ?Medications ?Compliance-  otaking lisinopril/HCTZ.  Edema- no ?BMET ?   ?Component Value Date/Time  ? NA 139 04/22/2021 0823  ? K 3.4 (L) 04/22/2021 1941  ? CL 103 04/22/2021 0823  ? CO2 31 04/22/2021 0823  ? GLUCOSE 117 (H) 04/22/2021 7408  ? BUN 17 04/22/2021 0823  ? CREATININE 1.05 04/22/2021 0823  ? CALCIUM 9.1 04/22/2021 0823  ? ?Prediabetes: Patient drinks 2-3 sweet teas per day.  Eats mostly grilled meats.  Does get fruits and vegetables.  His job is very active.  No polyuria or polydipsia. ? ?Social History  ? ?Tobacco Use  ?Smoking Status Never  ?Smokeless Tobacco Never  ? ? ?Current Outpatient Medications on File Prior to Visit  ?Medication Sig Dispense Refill  ? lisinopril-hydrochlorothiazide (ZESTORETIC) 20-25 MG tablet TAKE 1 TABLET BY MOUTH EVERY DAY 30 tablet 5  ? ?No current facility-administered medications on file prior to visit.  ? ? ? ?ROS see history of present illness ? ?Objective ? ?Physical Exam ?Vitals:  ? 10/21/21 0810  ?BP: 110/80  ?Pulse: (!) 57  ?Temp: 97.9 ?F (36.6 ?C)  ?SpO2: 97%  ? ? ?BP Readings from Last 3 Encounters:  ?10/21/21 110/80  ?04/22/21 130/78  ?10/16/20 120/80  ? ?Wt Readings from Last 3 Encounters:  ?10/21/21 220 lb 6.4 oz (100 kg)  ?04/22/21 217 lb 6.4 oz (98.6 kg)  ?10/16/20 214 lb 6.4 oz (97.3 kg)  ? ? ?Physical Exam ?Constitutional:   ?   General: He is not in acute distress. ?   Appearance: He is not diaphoretic.  ?Cardiovascular:  ?   Rate and Rhythm: Normal rate and regular rhythm.  ?   Heart sounds: Normal heart sounds.  ?Pulmonary:  ?   Effort: Pulmonary effort is normal.  ?   Breath sounds: Normal breath sounds.  ?Musculoskeletal:  ?   Right lower leg: No edema.  ?   Left lower leg: No edema.  ?Skin: ?   General: Skin is warm and dry.  ?Neurological:  ?   Mental  Status: He is alert.  ? ? ? ?Assessment/Plan: Please see individual problem list. ? ?Problem List Items Addressed This Visit   ? ? Elevated LDL cholesterol level (Chronic)  ?  Check lipid panel. ? ?  ?  ? Relevant Orders  ? Lipid panel  ? Comp Met (CMET)  ? Essential hypertension (Chronic)  ?  Well-controlled.  Check lab work today.  Continue 1 tablet lisinopril-HCTZ daily. ? ?  ?  ? Relevant Orders  ? Comp Met (CMET)  ? Prediabetes (Chronic)  ?  Encouraged reduction in sweets.  He will continue to remain active with his job. ? ?  ?  ? Relevant Orders  ? HgB A1c  ? Prostate cancer screening  ?  Check PSA. ? ?  ?  ? Relevant Orders  ? PSA  ? ? ? ?Return in about 6 months (around 04/23/2022) for CPE. ? ? ?Tommi Rumps, MD ?[place] ? ?

## 2021-10-21 NOTE — Assessment & Plan Note (Signed)
Check lipid panel  

## 2021-10-21 NOTE — Patient Instructions (Signed)
Nice to see you. ?Please try to reduce your sugary drink intake. ?You should try to get plenty of lean meats and fruits and vegetables. ?We will contact you with your lab results. ?

## 2021-10-21 NOTE — Assessment & Plan Note (Signed)
Check PSA. ?

## 2021-11-13 ENCOUNTER — Other Ambulatory Visit: Payer: Self-pay | Admitting: Family Medicine

## 2022-04-23 ENCOUNTER — Encounter: Payer: BC Managed Care – PPO | Admitting: Family Medicine

## 2022-05-18 ENCOUNTER — Other Ambulatory Visit: Payer: Self-pay | Admitting: Family Medicine

## 2022-06-03 ENCOUNTER — Ambulatory Visit (INDEPENDENT_AMBULATORY_CARE_PROVIDER_SITE_OTHER): Payer: BC Managed Care – PPO | Admitting: Family Medicine

## 2022-06-03 ENCOUNTER — Encounter: Payer: Self-pay | Admitting: Family Medicine

## 2022-06-03 VITALS — BP 118/70 | HR 71 | Temp 98.9°F | Ht 72.0 in | Wt 221.2 lb

## 2022-06-03 DIAGNOSIS — Z0001 Encounter for general adult medical examination with abnormal findings: Secondary | ICD-10-CM | POA: Diagnosis not present

## 2022-06-03 DIAGNOSIS — I10 Essential (primary) hypertension: Secondary | ICD-10-CM | POA: Diagnosis not present

## 2022-06-03 DIAGNOSIS — D171 Benign lipomatous neoplasm of skin and subcutaneous tissue of trunk: Secondary | ICD-10-CM | POA: Diagnosis not present

## 2022-06-03 DIAGNOSIS — D179 Benign lipomatous neoplasm, unspecified: Secondary | ICD-10-CM | POA: Insufficient documentation

## 2022-06-03 DIAGNOSIS — R7303 Prediabetes: Secondary | ICD-10-CM | POA: Diagnosis not present

## 2022-06-03 LAB — BASIC METABOLIC PANEL
BUN: 15 mg/dL (ref 6–23)
CO2: 32 mEq/L (ref 19–32)
Calcium: 9.3 mg/dL (ref 8.4–10.5)
Chloride: 100 mEq/L (ref 96–112)
Creatinine, Ser: 1.08 mg/dL (ref 0.40–1.50)
GFR: 78.51 mL/min (ref >60.00)
Glucose, Bld: 124 mg/dL — ABNORMAL HIGH (ref 70–99)
Potassium: 3.5 mEq/L (ref 3.5–5.1)
Sodium: 140 mEq/L (ref 135–145)

## 2022-06-03 LAB — HEMOGLOBIN A1C: Hgb A1c MFr Bld: 6.3 % (ref 4.6–6.5)

## 2022-06-03 NOTE — Assessment & Plan Note (Signed)
Lesion is consistent with a lipoma. I advised him to monitor for any changes. If it grows, hurts, or becomes firm he will let me know.

## 2022-06-03 NOTE — Assessment & Plan Note (Signed)
Physical exam completed. Discussed reducing beef and pork intake. Encouraged to reduce sweet tea intake. Colon cancer and prostate cancer screening is up to date. Patient declines flu, covid, and shingrix vaccines. He understands these are available if he changes his mind. Lab work as outlined.

## 2022-06-03 NOTE — Progress Notes (Signed)
Scott Rumps, MD Phone: [phone]  Scott Friedman is a 51 y.o. male who presents today for CPE.  Diet: lots of fish and red meat, does have eggs and bacon and sausage frequently for breakfast Exercise: job is active Colonoscopy: 05/16/19 10 year recall Prostate cancer screening: UTD Family history-  Prostate cancer: uncle  Colon cancer: 2 uncles, no first degree relatives Vaccines-   Flu: declines  Tetanus: UTD  Shingles: declines  COVID19: x2, declines further vaccines HIV screening: UTD Hep C Screening: deferred in past Tobacco use: no Alcohol use: 1/week Illicit Drug use: no Dentist: yes Ophthalmology: yes Lump on back: notes it has been present for 3 months. Denies pain. It has not grown any. No skin color change.     Active Ambulatory Problems    Diagnosis Date Noted   Essential hypertension 05/12/2016   Prediabetes 07/31/2017   Elevated LDL cholesterol level 10/28/2017   Colon cancer screening 03/28/2019   Prostate cancer screening 10/14/2019   Olecranon bursitis of left elbow 10/14/2019   Encounter for general adult medical examination with abnormal findings 04/16/2020   Lipoma 06/03/2022   Resolved Ambulatory Problems    Diagnosis Date Noted   Annual physical exam 05/12/2016   Past Medical History:  Diagnosis Date   Hypertension    Kidney stones    Migraine     Family History  Problem Relation Age of Onset   Breast cancer Mother    Hypertension Mother    Lung cancer Father    Colon cancer Maternal Uncle    Stroke Maternal Grandmother    Prostate cancer Maternal Uncle     Social History   Socioeconomic History   Marital status: Married    Spouse name: Not on file   Number of children: Not on file   Years of education: Not on file   Highest education level: Not on file  Occupational History   Not on file  Tobacco Use   Smoking status: Never   Smokeless tobacco: Never  Vaping Use   Vaping Use: Never used  Substance and Sexual  Activity   Alcohol use: Yes    Alcohol/week: 0.0 - 1.0 standard drinks of alcohol   Drug use: No   Sexual activity: Yes    Partners: Female  Other Topics Concern   Not on file  Social History Narrative   Not on file   Social Determinants of Health   Financial Resource Strain: Not on file  Food Insecurity: Not on file  Transportation Needs: Not on file  Physical Activity: Not on file  Stress: Not on file  Social Connections: Not on file  Intimate Partner Violence: Not on file    ROS  General:  Negative for nexplained weight loss, fever Skin: Negative for new or changing mole, sore that won't heal HEENT: Negative for trouble hearing, trouble seeing, ringing in ears, mouth sores, hoarseness, change in voice, dysphagia. CV:  Negative for chest pain, dyspnea, edema, palpitations Resp: Negative for cough, dyspnea, hemoptysis GI: Negative for nausea, vomiting, diarrhea, constipation, abdominal pain, melena, hematochezia. GU: Negative for dysuria, incontinence, urinary hesitance, hematuria, vaginal or penile discharge, polyuria, sexual difficulty, lumps in testicle or breasts MSK: Negative for muscle cramps or aches, joint pain or swelling Neuro: Negative for headaches, weakness, numbness, dizziness, passing out/fainting Psych: Negative for depression, anxiety, memory problems  Objective  Physical Exam Vitals:   06/03/22 1252  BP: 118/70  Pulse: 71  Temp: 98.9 F (37.2 C)  SpO2: 98%  BP Readings from Last 3 Encounters:  06/03/22 118/70  10/21/21 110/80  04/22/21 130/78   Wt Readings from Last 3 Encounters:  06/03/22 221 lb 3.2 oz (100.3 kg)  10/21/21 220 lb 6.4 oz (100 kg)  04/22/21 217 lb 6.4 oz (98.6 kg)    Physical Exam Constitutional:      General: He is not in acute distress.    Appearance: He is not diaphoretic.  Cardiovascular:     Rate and Rhythm: Normal rate and regular rhythm.     Heart sounds: Normal heart sounds.  Pulmonary:     Effort:  Pulmonary effort is normal.     Breath sounds: Normal breath sounds.  Abdominal:     General: Bowel sounds are normal. There is no distension.     Palpations: Abdomen is soft.     Tenderness: There is no abdominal tenderness.  Musculoskeletal:     Right lower leg: No edema.     Left lower leg: No edema.  Lymphadenopathy:     Cervical: No cervical adenopathy.  Skin:    General: Skin is warm and dry.       Neurological:     Mental Status: He is alert.  Psychiatric:        Mood and Affect: Mood normal.      Assessment/Plan:   Encounter for general adult medical examination with abnormal findings Assessment & Plan: Physical exam completed. Discussed reducing beef and pork intake. Encouraged to reduce sweet tea intake. Colon cancer and prostate cancer screening is up to date. Patient declines flu, covid, and shingrix vaccines. He understands these are available if he changes his mind. Lab work as outlined.    Prediabetes -     Hemoglobin A1c  Essential hypertension -     Basic metabolic panel  Lipoma of torso Assessment & Plan: Lesion is consistent with a lipoma. I advised him to monitor for any changes. If it grows, hurts, or becomes firm he will let me know.      Return in about 6 months (around 12/03/2022).   Scott Rumps, MD [place]

## 2022-11-15 ENCOUNTER — Other Ambulatory Visit: Payer: Self-pay | Admitting: Family Medicine

## 2022-11-16 ENCOUNTER — Ambulatory Visit
Admission: EM | Admit: 2022-11-16 | Discharge: 2022-11-16 | Disposition: A | Discharge status: Home / Self Care | Payer: BC Managed Care – PPO | Attending: Emergency Medicine | Admitting: Emergency Medicine

## 2022-11-16 DIAGNOSIS — J069 Acute upper respiratory infection, unspecified: Secondary | ICD-10-CM

## 2022-11-16 DIAGNOSIS — J040 Acute laryngitis: Secondary | ICD-10-CM

## 2022-11-16 DIAGNOSIS — I10 Essential (primary) hypertension: Secondary | ICD-10-CM | POA: Diagnosis not present

## 2022-11-16 MED ORDER — BENZONATATE 100 MG PO CAPS: 100.0000 mg | ORAL_CAPSULE | Freq: Three times a day (TID) | ORAL | 0 refills | Status: DC | PRN

## 2022-11-16 MED ORDER — PREDNISONE 10 MG (21) PO TBPK: ORAL_TABLET | Freq: Every day | ORAL | 0 refills | Status: DC

## 2022-11-16 NOTE — ED Provider Notes (Signed)
Scott Friedman    CSN: [id number] Arrival date & time: 11/16/22  0955      History   Chief Complaint Chief Complaint  Patient presents with   Nasal Congestion   Cough    HPI Scott Friedman is a 52 y.o. male.  Patient presents with 5 day history of congestion, postnasal drip, hoarse voice, cough.  No OTC medications taken today; took OTC nighttime cough syrup last night.  He denies fever, chest pain, shortness of breath, or other symptoms.  Negative COVID test at home.  His medical history includes hypertension, kidney stones, migraine headache, prediabetes.   The history is provided by the patient, medical records and the spouse.    Past Medical History:  Diagnosis Date   Hypertension    Kidney stones    Migraine     Patient Active Problem List   Diagnosis Date Noted   Lipoma 06/03/2022   Encounter for general adult medical examination with abnormal findings 04/16/2020   Prostate cancer screening 10/14/2019   Olecranon bursitis of left elbow 10/14/2019   Colon cancer screening 03/28/2019   Elevated LDL cholesterol level 10/28/2017   Prediabetes 07/31/2017   Essential hypertension 05/12/2016    Past Surgical History:  Procedure Laterality Date   COLONOSCOPY WITH PROPOFOL N/A 05/16/2019   Procedure: COLONOSCOPY WITH PROPOFOL;  Surgeon: Midge Minium, MD;  Location: [facility];  Service: Endoscopy;  Laterality: N/A;  prefers early   NO PAST SURGERIES         Home Medications    Prior to Admission medications   Medication Sig Start Date End Date Taking? Authorizing Provider  benzonatate (TESSALON) 100 MG capsule Take 1 capsule (100 mg total) by mouth 3 (three) times daily as needed for cough. 11/16/22  Yes Mickie Bail, NP  predniSONE (STERAPRED UNI-PAK 21 TAB) 10 MG (21) TBPK tablet Take by mouth daily. As directed 11/16/22  Yes Mickie Bail, NP  lisinopril-hydrochlorothiazide (ZESTORETIC) 20-25 MG tablet TAKE 1 TABLET BY MOUTH EVERY DAY 05/18/22    Glori Luis, MD    Family History Family History  Problem Relation Age of Onset   Breast cancer Mother    Hypertension Mother    Lung cancer Father    Colon cancer Maternal Uncle    Stroke Maternal Grandmother    Prostate cancer Maternal Uncle     Social History Social History   Tobacco Use   Smoking status: Never   Smokeless tobacco: Never  Vaping Use   Vaping Use: Never used  Substance Use Topics   Alcohol use: Yes    Alcohol/week: 0.0 - 1.0 standard drinks of alcohol   Drug use: No     Allergies   Patient has no known allergies.   Review of Systems Review of Systems  Constitutional:  Negative for chills and fever.  HENT:  Positive for congestion, postnasal drip, sore throat and voice change. Negative for ear pain.   Respiratory:  Positive for cough. Negative for shortness of breath.   Cardiovascular:  Negative for chest pain and palpitations.  Gastrointestinal:  Negative for diarrhea and vomiting.  Skin:  Negative for color change and rash.  All other systems reviewed and are negative.    Physical Exam Triage Vital Signs ED Triage Vitals [11/16/22 1004]  Enc Vitals Group     BP      Pulse Rate 75     Resp 18     Temp 98.6 F (37 C)  Temp src      SpO2 94 %     Weight      Height      Head Circumference      Peak Flow      Pain Score      Pain Loc      Pain Edu?      Excl. in GC?    No data found.  Updated Vital Signs BP (!) 136/90   Pulse 75   Temp 98.6 F (37 C)   Resp 18   SpO2 94%   Visual Acuity Right Eye Distance:   Left Eye Distance:   Bilateral Distance:    Right Eye Near:   Left Eye Near:    Bilateral Near:     Physical Exam Vitals and nursing note reviewed.  Constitutional:      General: He is not in acute distress.    Appearance: Normal appearance. He is well-developed. He is not ill-appearing.  HENT:     Right Ear: Tympanic membrane normal.     Left Ear: Tympanic membrane normal.     Nose: Nose normal.      Mouth/Throat:     Mouth: Mucous membranes are moist.     Pharynx: Oropharynx is clear.     Comments: Clear PND. Cardiovascular:     Rate and Rhythm: Normal rate and regular rhythm.     Heart sounds: Normal heart sounds.  Pulmonary:     Effort: Pulmonary effort is normal. No respiratory distress.     Breath sounds: Normal breath sounds.  Musculoskeletal:     Cervical back: Neck supple.  Skin:    General: Skin is warm and dry.  Neurological:     Mental Status: He is alert.  Psychiatric:        Mood and Affect: Mood normal.        Behavior: Behavior normal.      UC Treatments / Results  Labs (all labs ordered are listed, but only abnormal results are displayed) Labs Reviewed - No data to display  EKG   Radiology No results found.  Procedures Procedures (including critical care time)  Medications Ordered in UC Medications - No data to display  Initial Impression / Assessment and Plan / UC Course  I have reviewed the triage vital signs and the nursing notes.  Pertinent labs & imaging results that were available during my care of the patient were reviewed by me and considered in my medical decision making (see chart for details).   Viral URI, laryngitis, elevated blood pressure reading with hypertension.  Patient has been symptomatic for 5 days.  Treating with prednisone taper and Tessalon Perles.  Education provided on viral URI and laryngitis.  Instructed patient to follow-up with his PCP if he is not improving.  Also discussed with patient that his blood pressure is elevated today and needs to be rechecked by PCP.  Education provided on managing hypertension.  He agrees to plan of care.    Final Clinical Impressions(s) / UC Diagnoses   Final diagnoses:  Viral URI  Laryngitis  Elevated blood pressure reading in office with diagnosis of hypertension     Discharge Instructions      Take the prednisone and Tessalon as directed.  Follow up with your primary  care provider if your symptoms are not improving.    Your blood pressure is elevated today at 136/90.  Please have this rechecked by your primary care provider in 2-4 weeks.  ED Prescriptions     Medication Sig Dispense Auth. Provider   predniSONE (STERAPRED UNI-PAK 21 TAB) 10 MG (21) TBPK tablet Take by mouth daily. As directed 21 tablet Mickie Bail, NP   benzonatate (TESSALON) 100 MG capsule Take 1 capsule (100 mg total) by mouth 3 (three) times daily as needed for cough. 21 capsule Mickie Bail, NP      PDMP not reviewed this encounter.   Mickie Bail, NP 11/16/22 1031

## 2022-11-16 NOTE — Discharge Instructions (Addendum)
Take the prednisone and Tessalon as directed.  Follow up with your primary care provider if your symptoms are not improving.    Your blood pressure is elevated today at 136/90.  Please have this rechecked by your primary care provider in 2-4 weeks.

## 2022-11-16 NOTE — ED Triage Notes (Signed)
Patient to Urgent Care with complaints of productive cough (discolored mucus)/ nasal/ head congestion/ hoarseness. Denies any known fevers.   Symptoms started five days ago. Taking otc cough medication at night.   Negative home covid test.

## 2022-11-24 ENCOUNTER — Ambulatory Visit: Payer: BC Managed Care – PPO | Admitting: Family Medicine

## 2022-11-24 ENCOUNTER — Encounter: Payer: Self-pay | Admitting: Family Medicine

## 2022-11-24 VITALS — BP 128/82 | HR 77 | Temp 98.0°F | Ht 72.0 in | Wt 219.4 lb

## 2022-11-24 DIAGNOSIS — J329 Chronic sinusitis, unspecified: Secondary | ICD-10-CM | POA: Insufficient documentation

## 2022-11-24 DIAGNOSIS — J0140 Acute pansinusitis, unspecified: Secondary | ICD-10-CM

## 2022-11-24 MED ORDER — AMOXICILLIN-POT CLAVULANATE 875-125 MG PO TABS: 1.0000 | ORAL_TABLET | Freq: Two times a day (BID) | ORAL | 0 refills | Status: DC

## 2022-11-24 NOTE — Progress Notes (Signed)
  Marikay Alar, MD Phone: [phone]  Scott Friedman is a 52 y.o. male who presents today for same-day visit.  Sinus infection: Patient reports onset of symptoms 2 weeks ago.  He has had cough and head congestion.  He is blowing yellow mucus out of his nose.  He notes no fever, shortness of breath, or taste or smell disturbances.  He does report a COVID test that was negative initially.  He has been taking NyQuil which has not been helping.  He notes he was also placed on steroids by urgent care and had little benefit with those.  Social History   Tobacco Use  Smoking Status Never  Smokeless Tobacco Never    Current Outpatient Medications on File Prior to Visit  Medication Sig Dispense Refill   lisinopril-hydrochlorothiazide (ZESTORETIC) 20-25 MG tablet TAKE 1 TABLET BY MOUTH EVERY DAY 90 tablet 1   No current facility-administered medications on file prior to visit.     ROS see history of present illness  Objective  Physical Exam Vitals:   11/24/22 1354  BP: 128/82  Pulse: 77  Temp: 98 F (36.7 C)  SpO2: 97%    BP Readings from Last 3 Encounters:  11/24/22 128/82  11/16/22 (!) 136/90  06/03/22 118/70   Wt Readings from Last 3 Encounters:  11/24/22 219 lb 6.4 oz (99.5 kg)  06/03/22 221 lb 3.2 oz (100.3 kg)  10/21/21 220 lb 6.4 oz (100 kg)    Physical Exam Constitutional:      General: He is not in acute distress.    Appearance: He is not diaphoretic.  HENT:     Right Ear: Tympanic membrane normal.     Left Ear: Tympanic membrane normal.  Cardiovascular:     Rate and Rhythm: Normal rate and regular rhythm.     Heart sounds: Normal heart sounds.  Pulmonary:     Effort: Pulmonary effort is normal.     Breath sounds: Normal breath sounds.  Skin:    General: Skin is warm and dry.  Neurological:     Mental Status: He is alert.      Assessment/Plan: Please see individual problem list.  Acute non-recurrent pansinusitis Assessment & Plan: Symptoms are  consistent with sinusitis.  Will treat with Augmentin 1 tablet twice daily for 7 days.  Discussed if is not improving over the next 3 to 4 days he should let us know.   Other orders -     Amoxicillin-Pot Clavulanate; Take 1 tablet by mouth 2 (two) times daily.  Dispense: 14 tablet; Refill: 0   Return if symptoms worsen or fail to improve.   Marikay Alar, MD [facility]

## 2022-11-24 NOTE — Assessment & Plan Note (Signed)
Symptoms are consistent with sinusitis.  Will treat with Augmentin 1 tablet twice daily for 7 days.  Discussed if is not improving over the next 3 to 4 days he should let us know.

## 2022-12-02 ENCOUNTER — Ambulatory Visit: Payer: BC Managed Care – PPO | Admitting: Family

## 2022-12-05 ENCOUNTER — Ambulatory Visit: Payer: BC Managed Care – PPO | Admitting: Family Medicine

## 2022-12-05 ENCOUNTER — Encounter: Payer: Self-pay | Admitting: Family Medicine

## 2022-12-05 VITALS — BP 118/82 | HR 78 | Temp 98.2°F | Ht 72.0 in | Wt 215.6 lb

## 2022-12-05 DIAGNOSIS — I10 Essential (primary) hypertension: Secondary | ICD-10-CM

## 2022-12-05 DIAGNOSIS — J0140 Acute pansinusitis, unspecified: Secondary | ICD-10-CM | POA: Diagnosis not present

## 2022-12-05 DIAGNOSIS — E7800 Pure hypercholesterolemia, unspecified: Secondary | ICD-10-CM | POA: Diagnosis not present

## 2022-12-05 DIAGNOSIS — R7303 Prediabetes: Secondary | ICD-10-CM

## 2022-12-05 DIAGNOSIS — Z125 Encounter for screening for malignant neoplasm of prostate: Secondary | ICD-10-CM | POA: Diagnosis not present

## 2022-12-05 LAB — COMPREHENSIVE METABOLIC PANEL
ALT: 44 U/L (ref 0–53)
AST: 17 U/L (ref 0–37)
Albumin: 4 g/dL (ref 3.5–5.2)
Alkaline Phosphatase: 62 U/L (ref 39–117)
BUN: 16 mg/dL (ref 6–23)
CO2: 30 mEq/L (ref 19–32)
Calcium: 9 mg/dL (ref 8.4–10.5)
Chloride: 98 mEq/L (ref 96–112)
Creatinine, Ser: 0.98 mg/dL (ref 0.40–1.50)
GFR: 87.91 mL/min (ref >60.00)
Glucose, Bld: 127 mg/dL — ABNORMAL HIGH (ref 70–99)
Potassium: 3.4 mEq/L — ABNORMAL LOW (ref 3.5–5.1)
Sodium: 138 mEq/L (ref 135–145)
Total Bilirubin: 0.8 mg/dL (ref 0.2–1.2)
Total Protein: 6.6 g/dL (ref 6.0–8.3)

## 2022-12-05 LAB — LIPID PANEL
Cholesterol: 177 mg/dL (ref 0–200)
HDL: 28.6 mg/dL — ABNORMAL LOW (ref >39.00)
LDL Cholesterol: 113 mg/dL — ABNORMAL HIGH (ref 0–99)
NonHDL: 148.38
Total CHOL/HDL Ratio: 6
Triglycerides: 176 mg/dL — ABNORMAL HIGH (ref 0.0–149.0)
VLDL: 35.2 mg/dL (ref 0.0–40.0)

## 2022-12-05 LAB — HEMOGLOBIN A1C: Hgb A1c MFr Bld: 6.6 % — ABNORMAL HIGH (ref 4.6–6.5)

## 2022-12-05 LAB — PSA: PSA: 0.57 ng/mL (ref 0.10–4.00)

## 2022-12-05 NOTE — Assessment & Plan Note (Signed)
Chronic issue.  Adequately controlled.  He will continue 1 tablet lisinopril-HCTZ daily.  Check labs.

## 2022-12-05 NOTE — Assessment & Plan Note (Signed)
Significantly improved.  He still has some post illness cough.  Discussed it could take 3 to 4 weeks for the cough to completely resolve.  If it is persistent after the next 3 to 4 weeks he will let us know.

## 2022-12-05 NOTE — Assessment & Plan Note (Signed)
Chronic issue.  Check lipid panel.  Encouraged healthy diet and exercise.  Discussed reduction of red meat and fried foods.

## 2022-12-05 NOTE — Progress Notes (Signed)
Marikay Alar, MD Phone: [phone]  Scott Friedman is a 52 y.o. male who presents today for f/u.  HYPERTENSION Disease Monitoring Home BP Monitoring not checking Chest pain- no    Dyspnea- no Medications Compliance-  taking lisinopril/HCTZ.  Edema- no BMET    Component Value Date/Time   NA 140 06/03/2022 1314   K 3.5 06/03/2022 1314   CL 100 06/03/2022 1314   CO2 32 06/03/2022 1314   GLUCOSE 124 (H) 06/03/2022 1314   BUN 15 06/03/2022 1314   CREATININE 1.08 06/03/2022 1314   CALCIUM 9.3 06/03/2022 1314   HLD: job is very active physically.  He eats red meat 3 times a week.  Does have some fried foods.  Oftentimes will have eggs, sausage and grits for breakfast.  Cough: Patient continues to have a mild cough that is progressively improving.  This is after recent illness for which he was treated for sinusitis with Augmentin.  He noted some bodyaches with the Augmentin though those went away when he finished the course.  He notes the cough is rarely productive.  No fever, wheezing, or shortness of breath.  Social History   Tobacco Use  Smoking Status Never  Smokeless Tobacco Never    Current Outpatient Medications on File Prior to Visit  Medication Sig Dispense Refill   lisinopril-hydrochlorothiazide (ZESTORETIC) 20-25 MG tablet TAKE 1 TABLET BY MOUTH EVERY DAY 90 tablet 1   No current facility-administered medications on file prior to visit.     ROS see history of present illness  Objective  Physical Exam Vitals:   12/05/22 0755  BP: 118/82  Pulse: 78  Temp: 98.2 F (36.8 C)  SpO2: 94%    BP Readings from Last 3 Encounters:  12/05/22 118/82  11/24/22 128/82  11/16/22 (!) 136/90   Wt Readings from Last 3 Encounters:  12/05/22 215 lb 9.6 oz (97.8 kg)  11/24/22 219 lb 6.4 oz (99.5 kg)  06/03/22 221 lb 3.2 oz (100.3 kg)    Physical Exam Constitutional:      General: He is not in acute distress.    Appearance: He is not diaphoretic.  Cardiovascular:      Rate and Rhythm: Normal rate and regular rhythm.     Heart sounds: Normal heart sounds.  Pulmonary:     Effort: Pulmonary effort is normal.     Breath sounds: Normal breath sounds.  Musculoskeletal:     Right lower leg: No edema.     Left lower leg: No edema.  Skin:    General: Skin is warm and dry.  Neurological:     Mental Status: He is alert.      Assessment/Plan: Please see individual problem list.  Essential hypertension Assessment & Plan: Chronic issue.  Adequately controlled.  He will continue 1 tablet lisinopril-HCTZ daily.  Check labs.  Orders: -     Comprehensive metabolic panel  Acute non-recurrent pansinusitis Assessment & Plan: Significantly improved.  He still has some post illness cough.  Discussed it could take 3 to 4 weeks for the cough to completely resolve.  If it is persistent after the next 3 to 4 weeks he will let us know.   Prediabetes Assessment & Plan: Chronic issue.  Check A1c.  Encouraged healthy diet and activity level.  Orders: -     Hemoglobin A1c  Elevated LDL cholesterol level Assessment & Plan: Chronic issue.  Check lipid panel.  Encouraged healthy diet and exercise.  Discussed reduction of red meat and fried foods.  Orders: -  Lipid panel -     Comprehensive metabolic panel  Prostate cancer screening -     PSA   Return in about 6 months (around 06/06/2023) for physical.   Marikay Alar, MD [facility]

## 2022-12-05 NOTE — Assessment & Plan Note (Signed)
Chronic issue.  Check A1c.  Encouraged healthy diet and activity level.

## 2023-05-22 ENCOUNTER — Other Ambulatory Visit: Payer: Self-pay | Admitting: Family Medicine

## 2023-06-09 ENCOUNTER — Encounter: Payer: BC Managed Care – PPO | Admitting: Family Medicine

## 2023-06-19 ENCOUNTER — Ambulatory Visit (INDEPENDENT_AMBULATORY_CARE_PROVIDER_SITE_OTHER): Payer: 59 | Admitting: Family Medicine

## 2023-06-19 ENCOUNTER — Encounter: Payer: Self-pay | Admitting: Family Medicine

## 2023-06-19 VITALS — BP 132/82 | HR 100 | Temp 97.6°F | Ht 72.0 in | Wt 224.0 lb

## 2023-06-19 DIAGNOSIS — Z0001 Encounter for general adult medical examination with abnormal findings: Secondary | ICD-10-CM

## 2023-06-19 DIAGNOSIS — E7800 Pure hypercholesterolemia, unspecified: Secondary | ICD-10-CM

## 2023-06-19 DIAGNOSIS — Z0000 Encounter for general adult medical examination without abnormal findings: Secondary | ICD-10-CM

## 2023-06-19 DIAGNOSIS — I10 Essential (primary) hypertension: Secondary | ICD-10-CM | POA: Diagnosis not present

## 2023-06-19 DIAGNOSIS — R7303 Prediabetes: Secondary | ICD-10-CM

## 2023-06-19 LAB — BASIC METABOLIC PANEL
BUN: 19 mg/dL (ref 6–23)
CO2: 26 meq/L (ref 19–32)
Calcium: 9.5 mg/dL (ref 8.4–10.5)
Chloride: 99 meq/L (ref 96–112)
Creatinine, Ser: 0.98 mg/dL (ref 0.40–1.50)
GFR: 87.58 mL/min (ref >60.00)
Glucose, Bld: 127 mg/dL — ABNORMAL HIGH (ref 70–99)
Potassium: 3.4 meq/L — ABNORMAL LOW (ref 3.5–5.1)
Sodium: 140 meq/L (ref 135–145)

## 2023-06-19 LAB — HEMOGLOBIN A1C: Hgb A1c MFr Bld: 6.9 % — ABNORMAL HIGH (ref 4.6–6.5)

## 2023-06-19 NOTE — Assessment & Plan Note (Signed)
 Physical exam completed.  Encouraged reduction in sugary drink intake.  Discussed healthy diet and exercise.  Patient declines flu vaccine, further COVID vaccines, and Shingrix vaccine.  Lab work as outlined.

## 2023-06-19 NOTE — Progress Notes (Signed)
 Camellia Her, MD Phone: [phone]  Scott Friedman is a 53 y.o. male who presents today for CPE.  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 Colonoscopy: UTD Prostate cancer screening: UTD Family history-  Prostate cancer: no  Colon cancer: uncles and aunt Vaccines-   Flu: declines  Tetanus: UTD  Shingles: declines  COVID19: x1 HIV screening: UTD Hep C Screening: UTD Tobacco use: no Alcohol use: 2/month Illicit Drug use: no Dentist: yes Ophthalmology: yes   Active Ambulatory Problems    Diagnosis Date Noted   Essential hypertension 05/12/2016   Prediabetes 07/31/2017   Elevated LDL cholesterol level 10/28/2017   Colon cancer screening 03/28/2019   Prostate cancer screening 10/14/2019   Olecranon bursitis of left elbow 10/14/2019   Routine general medical examination at a health care facility 04/16/2020   Lipoma 06/03/2022   Sinusitis 11/24/2022   Resolved Ambulatory Problems    Diagnosis Date Noted   Annual physical exam 05/12/2016   Past Medical History:  Diagnosis Date   Hypertension    Kidney stones    Migraine     Family History  Problem Relation Age of Onset   Breast cancer Mother    Hypertension Mother    Lung cancer Father    Colon cancer Maternal Uncle    Stroke Maternal Grandmother    Prostate cancer Maternal Uncle     Social History   Socioeconomic History   Marital status: Married    Spouse name: Not on file   Number of children: Not on file   Years of education: Not on file   Highest education level: Not on file  Occupational History   Not on file  Tobacco Use   Smoking status: Never   Smokeless tobacco: Never  Vaping Use   Vaping status: Never Used  Substance and Sexual Activity   Alcohol use: Yes    Alcohol/week: 0.0 - 1.0 standard drinks of alcohol   Drug use: No   Sexual activity: Yes    Partners: Female  Other  Topics Concern   Not on file  Social History Narrative   Not on file   Social Drivers of Health   Financial Resource Strain: Not on file  Food Insecurity: Not on file  Transportation Needs: Not on file  Physical Activity: Not on file  Stress: Not on file  Social Connections: Not on file  Intimate Partner Violence: Not on file    ROS  General:  Negative for nexplained weight loss, fever Skin: Negative for new or changing mole, sore that won't heal HEENT: Negative for trouble hearing, trouble seeing, ringing in ears, mouth sores, hoarseness, change in voice, dysphagia. CV:  Negative for chest pain, dyspnea, edema, palpitations Resp: Negative for cough, dyspnea, hemoptysis GI: Negative for nausea, vomiting, diarrhea, constipation, abdominal pain, melena, hematochezia. GU: Negative for dysuria, incontinence, urinary hesitance, hematuria, vaginal or penile discharge, polyuria, sexual difficulty, lumps in testicle or breasts MSK: Negative for muscle cramps or aches, joint pain or swelling Neuro: Negative for headaches, weakness, numbness, dizziness, passing out/fainting Psych: Negative for depression, anxiety, memory problems  Objective  Physical Exam Vitals:   06/19/23 1256  BP: 132/82  Pulse: 100  Temp: 97.6 F (36.4 C)  SpO2: 95%    BP Readings from Last 3 Encounters:  06/19/23 132/82  12/05/22 118/82  11/24/22 128/82   Wt Readings from Last 3 Encounters:  06/19/23 224 lb (  101.6 kg)  12/05/22 215 lb 9.6 oz (97.8 kg)  11/24/22 219 lb 6.4 oz (99.5 kg)    Physical Exam Constitutional:      General: He is not in acute distress.    Appearance: He is not diaphoretic.  HENT:     Head: Normocephalic and atraumatic.  Cardiovascular:     Rate and Rhythm: Normal rate and regular rhythm.     Heart sounds: Normal heart sounds.  Pulmonary:     Effort: Pulmonary effort is normal.     Breath sounds: Normal breath sounds.  Abdominal:     General: Bowel sounds are normal.  There is no distension.     Palpations: Abdomen is soft.     Tenderness: There is no abdominal tenderness.  Musculoskeletal:     Right lower leg: No edema.     Left lower leg: No edema.  Lymphadenopathy:     Cervical: No cervical adenopathy.  Skin:    General: Skin is warm and dry.  Neurological:     Mental Status: He is alert.  Psychiatric:        Mood and Affect: Mood normal.      Assessment/Plan:   Routine general medical examination at a health care facility Assessment & Plan: Physical exam completed.  Encouraged reduction in sugary drink intake.  Discussed healthy diet and exercise.  Patient declines flu vaccine, further COVID vaccines, and Shingrix vaccine.  Lab work as outlined.   Prediabetes -     Hemoglobin A1c  Essential hypertension -     Basic metabolic panel    Return in about 6 months (around 12/17/2023) for transfer of care.   Camellia Her, MD [facility]

## 2023-06-23 ENCOUNTER — Telehealth: Payer: Self-pay

## 2023-06-23 NOTE — Telephone Encounter (Signed)
 VM left asking pt to CB in regards to lab results:   Please let the patient know that his A1c has worsened slightly.  He remains in the diabetic range indicating that he now has diabetes.  I would suggest starting metformin 500 mg twice daily to help prevent this from worsening.  His potassium is minimally low.  He needs to increase potassium rich foods in his diet.  He needs follow-up in 3 months for recheck of A1c.    Mychart msg has also been sent to pt

## 2023-09-10 ENCOUNTER — Telehealth: Payer: Self-pay | Admitting: Family Medicine

## 2023-09-10 NOTE — Telephone Encounter (Signed)
 Dr Clent Ridges is leaving the practice and your appointment on 12/22/2023 needs to be rescheduled with another provider. Please call the office to reschedule your Transfer of Care to either Dr Charlann Lange, MD, Darleen Crocker or Kara Dies, NP.  E2C2 please schedule TOC

## 2023-09-22 ENCOUNTER — Ambulatory Visit: Payer: 59 | Admitting: Family Medicine

## 2023-09-22 ENCOUNTER — Encounter: Payer: Self-pay | Admitting: Family Medicine

## 2023-09-22 VITALS — BP 136/80 | HR 60 | Temp 98.2°F | Resp 20 | Ht 72.0 in | Wt 213.0 lb

## 2023-09-22 DIAGNOSIS — E1159 Type 2 diabetes mellitus with other circulatory complications: Secondary | ICD-10-CM

## 2023-09-22 DIAGNOSIS — E118 Type 2 diabetes mellitus with unspecified complications: Secondary | ICD-10-CM

## 2023-09-22 DIAGNOSIS — E1169 Type 2 diabetes mellitus with other specified complication: Secondary | ICD-10-CM | POA: Diagnosis not present

## 2023-09-22 DIAGNOSIS — E785 Hyperlipidemia, unspecified: Secondary | ICD-10-CM

## 2023-09-22 DIAGNOSIS — I152 Hypertension secondary to endocrine disorders: Secondary | ICD-10-CM

## 2023-09-22 DIAGNOSIS — E876 Hypokalemia: Secondary | ICD-10-CM

## 2023-09-22 LAB — COMPREHENSIVE METABOLIC PANEL WITH GFR
ALT: 18 U/L (ref 0–53)
AST: 14 U/L (ref 0–37)
Albumin: 4.4 g/dL (ref 3.5–5.2)
Alkaline Phosphatase: 47 U/L (ref 39–117)
BUN: 19 mg/dL (ref 6–23)
CO2: 29 meq/L (ref 19–32)
Calcium: 9.2 mg/dL (ref 8.4–10.5)
Chloride: 101 meq/L (ref 96–112)
Creatinine, Ser: 0.93 mg/dL (ref 0.40–1.50)
GFR: 93.09 mL/min (ref >60.00)
Glucose, Bld: 123 mg/dL — ABNORMAL HIGH (ref 70–99)
Potassium: 3.7 meq/L (ref 3.5–5.1)
Sodium: 138 meq/L (ref 135–145)
Total Bilirubin: 1 mg/dL (ref 0.2–1.2)
Total Protein: 6.8 g/dL (ref 6.0–8.3)

## 2023-09-22 LAB — LIPID PANEL
Cholesterol: 178 mg/dL (ref 0–200)
HDL: 29.4 mg/dL — ABNORMAL LOW (ref >39.00)
LDL Cholesterol: 118 mg/dL — ABNORMAL HIGH (ref 0–99)
NonHDL: 149.01
Total CHOL/HDL Ratio: 6
Triglycerides: 157 mg/dL — ABNORMAL HIGH (ref 0.0–149.0)
VLDL: 31.4 mg/dL (ref 0.0–40.0)

## 2023-09-22 LAB — CBC WITH DIFFERENTIAL/PLATELET
Basophils Absolute: 0 10*3/uL (ref 0.0–0.1)
Basophils Relative: 0.5 % (ref 0.0–3.0)
Eosinophils Absolute: 0.1 10*3/uL (ref 0.0–0.7)
Eosinophils Relative: 1.9 % (ref 0.0–5.0)
HCT: 45.4 % (ref 39.0–52.0)
Hemoglobin: 15.2 g/dL (ref 13.0–17.0)
Lymphocytes Relative: 27.8 % (ref 12.0–46.0)
Lymphs Abs: 1.6 10*3/uL (ref 0.7–4.0)
MCHC: 33.4 g/dL (ref 30.0–36.0)
MCV: 89.6 fl (ref 78.0–100.0)
Monocytes Absolute: 0.4 10*3/uL (ref 0.1–1.0)
Monocytes Relative: 7 % (ref 3.0–12.0)
Neutro Abs: 3.7 10*3/uL (ref 1.4–7.7)
Neutrophils Relative %: 62.8 % (ref 43.0–77.0)
Platelets: 235 10*3/uL (ref 150.0–400.0)
RBC: 5.07 Mil/uL (ref 4.22–5.81)
RDW: 13.2 % (ref 11.5–15.5)
WBC: 5.9 10*3/uL (ref 4.0–10.5)

## 2023-09-22 LAB — MICROALBUMIN / CREATININE URINE RATIO
Creatinine,U: 92.6 mg/dL
Microalb Creat Ratio: UNDETERMINED mg/g (ref 0.0–30.0)
Microalb, Ur: <0.7 mg/dL

## 2023-09-22 LAB — POCT GLYCOSYLATED HEMOGLOBIN (HGB A1C): Hemoglobin A1C: 6.1 % — AB (ref 4.0–5.6)

## 2023-09-22 MED ORDER — LISINOPRIL 20 MG PO TABS: 20.0000 mg | ORAL_TABLET | Freq: Every day | ORAL | 0 refills | Status: DC

## 2023-09-22 NOTE — Progress Notes (Signed)
 SUBJECTIVE:   Chief Complaint  Patient presents with   Diabetes   HPI Presents for follow up chronic disease management.  Discussed the use of AI scribe software for clinical note transcription with the patient, who gave verbal consent to proceed.  History of Present Illness Scott Friedman is a 53 year old male with diabetes who presents for a three-month follow-up.  He was last seen six months ago, with an A1c of 6.9. His A1c today is 6.1, showing improved control. His A1c levels have fluctuated between 5.0 and 6.9 over the past few years. He manages his diabetes through diet and lifestyle changes, focusing on carbohydrate control while traveling frequently for work. He has not started any diabetes medication, such as metformin. No chest pain, shortness of breath, or leg swelling.  His cholesterol levels were previously above target, with an LDL over 100.  His blood pressure is generally well-controlled, with recent readings around 136/80. He has had low potassium levels noted in past labs. He is currently taking Zestoretic  but plans to switch to lisinopril  alone to address the low potassium issue.  He travels frequently for work, often being on the road for sporadic periods.    PERTINENT PMH / PSH: As above  OBJECTIVE:  BP 136/80   Pulse 60   Temp 98.2 F (36.8 C)   Resp 20   Ht 6' (1.829 m)   Wt 213 lb (96.6 kg)   SpO2 98%   BMI 28.89 kg/m    Physical Exam Vitals reviewed.  Constitutional:      General: He is not in acute distress.    Appearance: Normal appearance. He is not ill-appearing, toxic-appearing or diaphoretic.  Eyes:     General:        Right eye: No discharge.        Left eye: No discharge.  Cardiovascular:     Rate and Rhythm: Normal rate and regular rhythm.     Heart sounds: Normal heart sounds.  Pulmonary:     Effort: Pulmonary effort is normal.     Breath sounds: Normal breath sounds.  Abdominal:     General: Bowel sounds are normal.   Musculoskeletal:        General: Normal range of motion.     Cervical back: Normal range of motion.  Skin:    General: Skin is warm and dry.  Neurological:     Mental Status: He is alert and oriented to person, place, and time. Mental status is at baseline.  Psychiatric:        Mood and Affect: Mood normal.        Behavior: Behavior normal.        Thought Content: Thought content normal.        Judgment: Judgment normal.           09/22/2023    8:00 AM 12/05/2022    7:57 AM 11/24/2022    1:56 PM 06/03/2022   12:53 PM 10/21/2021    8:13 AM  Depression screen PHQ 2/9  Decreased Interest 0 0 0 0 0  Down, Depressed, Hopeless 0 0 0 0 0  PHQ - 2 Score 0 0 0 0 0  Altered sleeping 0 0 0 0   Tired, decreased energy 0 0 0 0   Change in appetite 0 0 0 0   Feeling bad or failure about yourself  0 0 0 0   Trouble concentrating 0 0 0 0   Moving slowly or fidgety/restless  0 0 0 0   Suicidal thoughts 0 0 0 0   PHQ-9 Score 0 0 0 0   Difficult doing work/chores Not difficult at all Not difficult at all Not difficult at all Not difficult at all       09/22/2023    8:00 AM 12/05/2022    7:57 AM 11/24/2022    1:57 PM 06/03/2022   12:53 PM  GAD 7 : Generalized Anxiety Score  Nervous, Anxious, on Edge 0 0 0 0  Control/stop worrying 0 0 0 0  Worry too much - different things 0 0 0 0  Trouble relaxing 0 0 0 0  Restless 0 0 0 0  Easily annoyed or irritable 0 0 0 0  Afraid - awful might happen 0 0 0 0  Total GAD 7 Score 0 0 0 0  Anxiety Difficulty Not difficult at all Not difficult at all Not difficult at all Not difficult at all    ASSESSMENT/PLAN:  Type 2 diabetes mellitus with complication [facility]) Assessment & Plan: Diabetes well-controlled with A1c of 6.1. Higher cardiovascular risk due to diabetes. Prefers non-pharmacological management. Discussed potential medication need if A1c increases. 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 - Monitor A1c levels  regularly. - Encourage lifestyle modifications to maintain A1c below 7. - Discuss potential future use of metformin if A1c increases. - Annual eye and foot exams due to diabetes.  Orders: -     CBC with Differential/Platelet -     Microalbumin / creatinine urine ratio -     POCT glycosylated hemoglobin (Hb A1C)  Hypertension associated with diabetes (HCC) Assessment & Plan: Blood pressure 136/80 mmHg, acceptable for diabetes management. Low potassium likely due to hydrochlorothiazide . Plan to discontinue hydrochlorothiazide  and continue lisinopril  20 mg. Discussed potential addition of amlodipine or carvedilol if blood pressure increases. - Discontinue hydrochlorothiazide . - Continue lisinopril  20 mg. - Monitor blood pressure regularly. - Consider adding amlodipine or carvedilol if blood pressure increases.  Orders: -     Comprehensive metabolic panel with GFR -     Lisinopril ; Take 1 tablet (20 mg total) by mouth daily.  Dispense: 90 tablet; Refill: 0  Hyperlipidemia associated with type 2 diabetes mellitus (HCC) Assessment & Plan: LDL above target, increased cardiovascular risk due to elevated cholesterol and diabetes. Discussed potential use of Crestor to lower LDL by half, decision deferred until current levels checked. Current 10-year cardiovascular risk is 16.9%. - Check fasting lipid panel. - Consider starting low-dose Crestor if LDL remains above 100 mg/dL.    Orders: -     Lipid panel  Hypokalemia Assessment & Plan: Low potassium likely due to hydrochlorothiazide . Plan to discontinue hydrochlorothiazide  to prevent further depletion. Discussed risks of low potassium, including potential arrhythmias. - Discontinue hydrochlorothiazide . - Monitor potassium levels. - Consider potassium supplementation if levels remain low.     PDMP reviewed  Return in about 2 months (around 11/22/2023) for PCP, HTN.  Valli Gaw, MD

## 2023-09-22 NOTE — Patient Instructions (Addendum)
 It was a pleasure meeting you today. Thank you for allowing me to take part in your health care.  Our goals for today as we discussed include:   Stop Zestoretic Continue Lisinopril 20 mg at night.  Goal blood pressure <140/90  Recommend starting Crestor 10 mg daily.  Goal LDL <70  Recommend Starting Metformin XR 500 mg daily. Goal A1c <7.0  Recommend annual eye exam Recommend annual foot exam  Follow up in 2 months   This is a list of the screening recommended for you and due dates:  Health Maintenance  Topic Date Due   Pneumococcal Vaccination (1 of 2 - PCV) Never done   Complete foot exam   Never done   Eye exam for diabetics  Never done   Yearly kidney health urinalysis for diabetes  Never done   Zoster (Shingles) Vaccine (1 of 2) Never done   COVID-19 Vaccine (3 - 2024-25 season) 02/15/2023   Hemoglobin A1C  12/17/2023   Flu Shot  01/15/2024   Yearly kidney function blood test for diabetes  06/18/2024   DTaP/Tdap/Td vaccine (2 - Td or Tdap) 06/10/2025   Colon Cancer Screening  05/15/2029   HIV Screening  Completed   HPV Vaccine  Aged Out   Hepatitis C Screening  Discontinued     If you have any questions or concerns, please do not hesitate to call the office at [phone].  I look forward to our next visit and until then take care and stay safe.  Regards,   Dana Allan, MD   [facility]

## 2023-10-04 ENCOUNTER — Encounter: Payer: Self-pay | Admitting: Family Medicine

## 2023-10-04 DIAGNOSIS — E876 Hypokalemia: Secondary | ICD-10-CM | POA: Insufficient documentation

## 2023-10-04 DIAGNOSIS — E1169 Type 2 diabetes mellitus with other specified complication: Secondary | ICD-10-CM | POA: Insufficient documentation

## 2023-10-04 NOTE — Assessment & Plan Note (Addendum)
 LDL above target, increased cardiovascular risk due to elevated cholesterol and diabetes. Discussed potential use of Crestor to lower LDL by half, decision deferred until current levels checked. Current 10-year cardiovascular risk is 16.9%. - Check fasting lipid panel. - Consider starting low-dose Crestor if LDL remains above 100 mg/dL.

## 2023-10-04 NOTE — Assessment & Plan Note (Signed)
 Blood pressure 136/80 mmHg, acceptable for diabetes management. Low potassium likely due to hydrochlorothiazide . Plan to discontinue hydrochlorothiazide  and continue lisinopril  20 mg. Discussed potential addition of amlodipine or carvedilol if blood pressure increases. - Discontinue hydrochlorothiazide . - Continue lisinopril  20 mg. - Monitor blood pressure regularly. - Consider adding amlodipine or carvedilol if blood pressure increases.

## 2023-10-04 NOTE — Assessment & Plan Note (Signed)
 Low potassium likely due to hydrochlorothiazide . Plan to discontinue hydrochlorothiazide  to prevent further depletion. Discussed risks of low potassium, including potential arrhythmias. - Discontinue hydrochlorothiazide . - Monitor potassium levels. - Consider potassium supplementation if levels remain low.

## 2023-10-04 NOTE — Assessment & Plan Note (Signed)
 Diabetes well-controlled with A1c of 6.1. Higher cardiovascular risk due to diabetes. Prefers non-pharmacological management. Discussed potential medication need if A1c increases. 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 - Encourage lifestyle modifications to maintain A1c below 7. - Discuss potential future use of metformin if A1c increases. - Annual eye and foot exams due to diabetes.

## 2023-10-05 ENCOUNTER — Telehealth: Payer: Self-pay

## 2023-10-05 NOTE — Telephone Encounter (Signed)
-----   Message from Valli Gaw sent at 10/04/2023  1:36 PM EDT -----  LDL (bad) cholesterol was 161 which increased. (nml is <70).   Triglycerides 157 (nml is < 150), decreased.  You can help decrease these numbers by: -limiting your intake of processed foods and foods high in saturated fats.  -increasing your physical activity -increasing your intake of vegetables, if not allergic, healthy fish (bluefin tuna, wild salmon, and sardines), whole grains and fiber rich foods .   -You can also use extra virgin olive oil instead of butter. -If you smoke, quitting can decrease LDL cholesterol and raise HDL cholesterol.  With history of diabetes and high blood pressure recommend statin therapy.  If agreeable will send in Crestor 20 mg daily.   All other blood work acceptable

## 2023-10-05 NOTE — Telephone Encounter (Signed)
 Left message to return call to our office.  Okay to relay result notes to pt. Please document when pt is spoke to.

## 2023-11-24 ENCOUNTER — Ambulatory Visit: Admitting: Family Medicine

## 2023-11-25 ENCOUNTER — Encounter: Payer: Self-pay | Admitting: Family Medicine

## 2023-11-25 ENCOUNTER — Ambulatory Visit: Admitting: Family Medicine

## 2023-11-25 VITALS — BP 116/74 | HR 58 | Temp 97.9°F | Resp 20 | Ht 72.0 in | Wt 209.1 lb

## 2023-11-25 DIAGNOSIS — E1159 Type 2 diabetes mellitus with other circulatory complications: Secondary | ICD-10-CM | POA: Diagnosis not present

## 2023-11-25 DIAGNOSIS — E118 Type 2 diabetes mellitus with unspecified complications: Secondary | ICD-10-CM | POA: Diagnosis not present

## 2023-11-25 DIAGNOSIS — E876 Hypokalemia: Secondary | ICD-10-CM | POA: Diagnosis not present

## 2023-11-25 DIAGNOSIS — I152 Hypertension secondary to endocrine disorders: Secondary | ICD-10-CM

## 2023-11-25 DIAGNOSIS — E1169 Type 2 diabetes mellitus with other specified complication: Secondary | ICD-10-CM | POA: Diagnosis not present

## 2023-11-25 DIAGNOSIS — E785 Hyperlipidemia, unspecified: Secondary | ICD-10-CM

## 2023-11-25 MED ORDER — LISINOPRIL 20 MG PO TABS
20.0000 mg | ORAL_TABLET | Freq: Every day | ORAL | 3 refills | Status: DC
Start: 2023-11-25 — End: 2024-04-18

## 2023-11-25 NOTE — Assessment & Plan Note (Addendum)
 Diabetes management focuses on controlling associated risk factors such as hyperlipidemia. He is not on metformin despite a discrepancy noted in another medical record, and there is no current prescription for metformin from this provider. Diet controlled - Blood pressure well controlled - Declined statin - Recommend annual eye and foot exam

## 2023-11-25 NOTE — Assessment & Plan Note (Signed)
Resolved with discontinuation of HCTZ.

## 2023-11-25 NOTE — Assessment & Plan Note (Signed)
 Hypertension is well-controlled at 116/74 mmHg after discontinuation of hydrochlorothiazide , currently managed with ACEi - Refill Lisinopril  20 mg daily. - Monitor blood pressure regularly.

## 2023-11-25 NOTE — Progress Notes (Signed)
 SUBJECTIVE:   Chief Complaint  Patient presents with   Diabetes    2 month follow up   HPI Presents for follow up chronic disease management  Discussed the use of AI scribe software for clinical note transcription with the patient, who gave verbal consent to proceed.  History of Present Illness Scott Friedman is a 53 year old male with hypertension who presents for a follow-up visit regarding hypertension management.  His blood pressure at home has been stable, with a current reading of 116/74 mmHg. He was previously taken off hydrochlorothiazide  and is currently taking Zestril  20 mg daily for blood pressure management.  He has a history of diabetes. Recent cholesterol levels showed an LDL level of 118 mg/dL. He is not currently on any cholesterol-lowering medication. There was a mention of metformin in his chart, but he confirms he is not taking it and has not been prescribed it from any other facility.     PERTINENT PMH / PSH: As above  OBJECTIVE:  BP 116/74   Pulse (!) 58   Temp 97.9 F (36.6 C)   Resp 20   Ht 6' (1.829 m)   Wt 209 lb 2 oz (94.9 kg)   SpO2 97%   BMI 28.36 kg/m    Physical Exam Vitals reviewed.  Constitutional:      General: He is not in acute distress.    Appearance: Normal appearance. He is normal weight. He is not ill-appearing, toxic-appearing or diaphoretic.  Eyes:     General:        Right eye: No discharge.        Left eye: No discharge.  Cardiovascular:     Rate and Rhythm: Normal rate.  Pulmonary:     Effort: Pulmonary effort is normal.  Musculoskeletal:        General: Normal range of motion.     Cervical back: Normal range of motion.  Skin:    General: Skin is warm and dry.  Neurological:     Mental Status: He is alert and oriented to person, place, and time. Mental status is at baseline.  Psychiatric:        Mood and Affect: Mood normal.        Behavior: Behavior normal.        Thought Content: Thought content normal.         Judgment: Judgment normal.           11/25/2023    8:30 AM 09/22/2023    8:00 AM 12/05/2022    7:57 AM 11/24/2022    1:56 PM 06/03/2022   12:53 PM  Depression screen PHQ 2/9  Decreased Interest 0 0 0 0 0  Down, Depressed, Hopeless 0 0 0 0 0  PHQ - 2 Score 0 0 0 0 0  Altered sleeping 0 0 0 0 0  Tired, decreased energy 0 0 0 0 0  Change in appetite 0 0 0 0 0  Feeling bad or failure about yourself  0 0 0 0 0  Trouble concentrating 0 0 0 0 0  Moving slowly or fidgety/restless 0 0 0 0 0  Suicidal thoughts 0 0 0 0 0  PHQ-9 Score 0 0 0 0 0  Difficult doing work/chores Not difficult at all Not difficult at all Not difficult at all Not difficult at all Not difficult at all      09/22/2023    8:00 AM 12/05/2022    7:57 AM 11/24/2022    1:57  PM 06/03/2022   12:53 PM  GAD 7 : Generalized Anxiety Score  Nervous, Anxious, on Edge 0 0 0 0  Control/stop worrying 0 0 0 0  Worry too much - different things 0 0 0 0  Trouble relaxing 0 0 0 0  Restless 0 0 0 0  Easily annoyed or irritable 0 0 0 0  Afraid - awful might happen 0 0 0 0  Total GAD 7 Score 0 0 0 0  Anxiety Difficulty Not difficult at all Not difficult at all Not difficult at all Not difficult at all    ASSESSMENT/PLAN:  Type 2 diabetes mellitus with complication [facility]) Assessment & Plan: Diabetes management focuses on controlling associated risk factors such as hyperlipidemia. He is not on metformin despite a discrepancy noted in another medical record, and there is no current prescription for metformin from this provider. Diet controlled - Blood pressure well controlled - Declined statin - Recommend annual eye and foot exam   Hypertension associated with diabetes [facility]) Assessment & Plan: Hypertension is well-controlled at 116/74 mmHg after discontinuation of hydrochlorothiazide , currently managed with ACEi - Refill Lisinopril  20 mg daily. - Monitor blood pressure regularly.  Orders: -     Lisinopril ; Take 1 tablet (20 mg  total) by mouth daily.  Dispense: 90 tablet; Refill: 3  Hyperlipidemia associated with type 2 diabetes mellitus (HCC) Assessment & Plan: Cholesterol levels are not at goal for diabetes management, with LDL at 118 mg/dL. Recommended LDL goal is less than 70 mg/dL due to increased cardiovascular risk associated with diabetes. Discussed potential benefits of starting a low-dose statin to reduce LDL levels by half. He declined statin therapy, preferring lifestyle modifications. Informed that statin therapy could significantly lower LDL levels and reduce cardiovascular risk. - Advise dietary modifications to reduce saturated fats and fried foods. - Reassess cholesterol levels and statin therapy decision in future visits.    Hypokalemia Assessment & Plan: Resolved with discontinuation of HCTZ     PDMP reviewed  Return in about 6 months (around 05/26/2024) for PCP.  Valli Gaw, MD

## 2023-11-25 NOTE — Assessment & Plan Note (Signed)
 Cholesterol levels are not at goal for diabetes management, with LDL at 118 mg/dL. Recommended LDL goal is less than 70 mg/dL due to increased cardiovascular risk associated with diabetes. Discussed potential benefits of starting a low-dose statin to reduce LDL levels by half. He declined statin therapy, preferring lifestyle modifications. Informed that statin therapy could significantly lower LDL levels and reduce cardiovascular risk. - Advise dietary modifications to reduce saturated fats and fried foods. - Reassess cholesterol levels and statin therapy decision in future visits.

## 2023-11-25 NOTE — Patient Instructions (Addendum)
 It was a pleasure meeting you today. Thank you for allowing me to take part in your health care.  Our goals for today as we discussed include:  Refill sent for Lisinopril  Blood pressure is at goal.   Continue to monitor at home.  Goal <140/90  Recommend annual eye and foot exam   Recommend Pneumonia 20 vaccine.  One time vaccine  Recommend Shingles vaccine.  This is a 2 dose series and can be given at your local pharmacy.  Please talk to your pharmacist about this.   Consider Crestor daily for high cholesterol   This is a list of the screening recommended for you and due dates:  Health Maintenance  Topic Date Due   Complete foot exam   Never done   Eye exam for diabetics  Never done   Pneumococcal Vaccination (1 of 2 - PCV) Never done   Zoster (Shingles) Vaccine (1 of 2) Never done   COVID-19 Vaccine (3 - 2024-25 season) 02/15/2023   Flu Shot  01/15/2024   Hemoglobin A1C  03/23/2024   Yearly kidney function blood test for diabetes  09/21/2024   Yearly kidney health urinalysis for diabetes  09/21/2024   DTaP/Tdap/Td vaccine (2 - Td or Tdap) 06/10/2025   Colon Cancer Screening  05/15/2029   HIV Screening  Completed   HPV Vaccine  Aged Out   Meningitis B Vaccine  Aged Out   Hepatitis C Screening  Discontinued      If you have any questions or concerns, please do not hesitate to call the office at [phone].  I look forward to our next visit and until then take care and stay safe.  Regards,   Valli Gaw, MD   [facility]

## 2023-12-22 ENCOUNTER — Encounter: Payer: 59 | Admitting: Family Medicine

## 2024-04-19 ENCOUNTER — Ambulatory Visit

## 2024-04-19 ENCOUNTER — Telehealth: Payer: Self-pay

## 2024-04-19 VITALS — BP 110/70 | HR 65 | Temp 98.7°F | Ht 73.0 in | Wt 214.4 lb

## 2024-04-19 DIAGNOSIS — E1159 Type 2 diabetes mellitus with other circulatory complications: Secondary | ICD-10-CM | POA: Diagnosis not present

## 2024-04-19 DIAGNOSIS — R7303 Prediabetes: Secondary | ICD-10-CM

## 2024-04-19 DIAGNOSIS — L814 Other melanin hyperpigmentation: Secondary | ICD-10-CM | POA: Insufficient documentation

## 2024-04-19 DIAGNOSIS — E1169 Type 2 diabetes mellitus with other specified complication: Secondary | ICD-10-CM | POA: Diagnosis not present

## 2024-04-19 DIAGNOSIS — I10 Essential (primary) hypertension: Secondary | ICD-10-CM

## 2024-04-19 DIAGNOSIS — D229 Melanocytic nevi, unspecified: Secondary | ICD-10-CM | POA: Diagnosis not present

## 2024-04-19 DIAGNOSIS — I152 Hypertension secondary to endocrine disorders: Secondary | ICD-10-CM | POA: Diagnosis not present

## 2024-04-19 DIAGNOSIS — E119 Type 2 diabetes mellitus without complications: Secondary | ICD-10-CM

## 2024-04-19 DIAGNOSIS — E118 Type 2 diabetes mellitus with unspecified complications: Secondary | ICD-10-CM

## 2024-04-19 DIAGNOSIS — E785 Hyperlipidemia, unspecified: Secondary | ICD-10-CM

## 2024-04-19 MED ORDER — LISINOPRIL 20 MG PO TABS: 20.0000 mg | ORAL_TABLET | Freq: Every day | ORAL | 3 refills | Status: AC

## 2024-04-19 NOTE — Assessment & Plan Note (Addendum)
 BP well controlled on current treatment. Recommend continue Lisinopril  20 mg daily. Reviewed CMP from 09/2023 which was normal. Refill sent.  Orders:   lisinopril  (ZESTRIL ) 20 MG tablet; Take 1 tablet (20 mg total) by mouth daily.

## 2024-04-19 NOTE — Assessment & Plan Note (Addendum)
 Continue lifestyle modifications, including low carbohydrate diet and regular exercise. Repeat A1c. Recommend updating flu, shingles, and pneumonia vaccines at local pharmacy. Orders:   Hemoglobin A1c; Future

## 2024-04-19 NOTE — Assessment & Plan Note (Addendum)
 Elevated LDL at 118 mg/dL. The 10-year ASCVD risk score (Arnett DK, et al., 2019) is: 13%. Hesitant to start stain due to family member who had adverse reaction to statin. Discussed continuing lifestyle changes and potential statin therapy. Emphasized shared decision-making. - Schedule fasting labs to reassess cholesterol levels. - Consider rosuvastatin 5 mg at bedtime if LDL remains over 100 mg/dL. - Continue lifestyle modifications, including Mediterranean diet and regular exercise.  Orders:   Lipid panel; Future

## 2024-04-19 NOTE — Telephone Encounter (Signed)
 Patient saw Dr. Kalpana Bair today and Dr. Abbey would like to see him again in three months.  Patient states he needs an early appointment, so we scheduled him for a follow-up with Dr. Abbey on 08/17/2024 (4 months out).  I let patient know that I would send a message to Dr. Abbey letting her know.

## 2024-04-19 NOTE — Assessment & Plan Note (Signed)
 Refer to dermatology for annual skin exam due to sun exposure and nevi.  Orders:   Ambulatory referral to Dermatology

## 2024-04-19 NOTE — Patient Instructions (Addendum)
 You qualify for shingles vaccine, pneumonia vaccine, flu vaccine. You can update these through your local pharmacy.  If your cholesterol shows low HDL or good cholesterol and your bad cholesterol or LDL is over 100, I recommend we start low dose statin called Rosuvastatin 5 mg at bedtime. Lets schedule fasting labs and we will reach out to you.

## 2024-04-19 NOTE — Progress Notes (Signed)
 Established Patient Office Visit TOC from Dr. Hope    Subjective  Patient ID: Scott Friedman, male    DOB: [birth year]  Age: 53 y.o. MRN: [id number]  Chief Complaint  Patient presents with   Establish Care    Discussed the use of AI scribe software for clinical note transcription with the patient, who gave verbal consent to proceed.  History of Present Illness Scott Friedman is a 53 year old male with hypertension who presents for a transfer of care visit from previous PCP.   He has a history of elevated hemoglobin A1c levels, previously in the diabetic range, controlled with diet and exercise. 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 He has stopped drinking sweet tea, which he believes has helped lower his A1c levels. His last A1c in April was 6.1%, down from a previous 6.9%.  He has a history of hypertension and is currently taking lisinopril  20 mg daily. He forgot to take his dose this morning. He was previously on HCTZ but was taken off due to low potassium levels.  He has a h/o elevated LDL cholesterol. He has been recommended to take statin but he has tried lifestyle modifications to help improve cholesterol level.   Previously follows up with a dermatologist at [place], he is interested in seeing a dermatologist locally for annual preventative skin exam.   He has not received a flu shot and only received the first COVID-19 vaccine dose two years ago. He has regular eye exams, with the last one being six months ago at [facility].      ROS As per HPI    Objective:     BP 110/70 (BP Location: Right Arm, Patient Position: Sitting, Cuff Size: Normal)   Pulse 65   Temp 98.7 F (37.1 C) (Oral)   Ht 6' 1 (1.854 m)   Wt 214 lb 6.4 oz (97.3 kg)   SpO2 96%   BMI 28.29 kg/m      04/19/2024   11:04 AM 11/25/2023    8:30 AM 09/22/2023    8:00 AM  Depression screen PHQ 2/9  Decreased Interest 0 0 0  Down, Depressed, Hopeless 0 0 0  PHQ  - 2 Score 0 0 0  Altered sleeping 0 0 0  Tired, decreased energy 0 0 0  Change in appetite 0 0 0  Feeling bad or failure about yourself  0 0 0  Trouble concentrating 0 0 0  Moving slowly or fidgety/restless 0 0 0  Suicidal thoughts 0 0 0  PHQ-9 Score 0 0 0  Difficult doing work/chores Not difficult at all Not difficult at all Not difficult at all      04/19/2024   11:04 AM 09/22/2023    8:00 AM 12/05/2022    7:57 AM 11/24/2022    1:57 PM  GAD 7 : Generalized Anxiety Score  Nervous, Anxious, on Edge 0 0 0 0  Control/stop worrying 0 0 0 0  Worry too much - different things 0 0 0 0  Trouble relaxing 0 0 0 0  Restless 0 0 0 0  Easily annoyed or irritable 0 0 0 0  Afraid - awful might happen 0 0 0 0  Total GAD 7 Score 0 0 0 0  Anxiety Difficulty Not difficult at all Not difficult at all Not difficult at all Not difficult at all      04/19/2024   11:04 AM 11/25/2023  8:30 AM 09/22/2023    8:00 AM  Depression screen PHQ 2/9  Decreased Interest 0 0 0  Down, Depressed, Hopeless 0 0 0  PHQ - 2 Score 0 0 0  Altered sleeping 0 0 0  Tired, decreased energy 0 0 0  Change in appetite 0 0 0  Feeling bad or failure about yourself  0 0 0  Trouble concentrating 0 0 0  Moving slowly or fidgety/restless 0 0 0  Suicidal thoughts 0 0 0  PHQ-9 Score 0 0 0  Difficult doing work/chores Not difficult at all Not difficult at all Not difficult at all      04/19/2024   11:04 AM 09/22/2023    8:00 AM 12/05/2022    7:57 AM 11/24/2022    1:57 PM  GAD 7 : Generalized Anxiety Score  Nervous, Anxious, on Edge 0 0 0 0  Control/stop worrying 0 0 0 0  Worry too much - different things 0 0 0 0  Trouble relaxing 0 0 0 0  Restless 0 0 0 0  Easily annoyed or irritable 0 0 0 0  Afraid - awful might happen 0 0 0 0  Total GAD 7 Score 0 0 0 0  Anxiety Difficulty Not difficult at all Not difficult at all Not difficult at all Not difficult at all   SDOH Screenings   Depression (PHQ2-9): Low Risk  (04/19/2024)   Tobacco Use: Low Risk  (04/19/2024)     Physical Exam Constitutional:      General: He is not in acute distress.    Appearance: Normal appearance.  HENT:     Head: Normocephalic and atraumatic.     Right Ear: Tympanic membrane and external ear normal. There is no impacted cerumen.     Left Ear: Tympanic membrane and external ear normal. There is no impacted cerumen.     Mouth/Throat:     Mouth: Mucous membranes are moist.  Neck:     Thyroid: No thyroid mass or thyroid tenderness.  Cardiovascular:     Rate and Rhythm: Normal rate and regular rhythm.  Pulmonary:     Effort: Pulmonary effort is normal.     Breath sounds: Normal breath sounds.  Abdominal:     General: Bowel sounds are normal.     Palpations: Abdomen is soft.     Tenderness: There is no abdominal tenderness. There is no guarding.  Musculoskeletal:     Cervical back: Neck supple. No rigidity.     Right lower leg: No edema.     Left lower leg: No edema.  Skin:    General: Skin is warm.     Comments: Multiple nevi on both arms, neck, forearm   Neurological:     Mental Status: He is alert and oriented to person, place, and time.  Psychiatric:        Mood and Affect: Mood normal.        Behavior: Behavior normal.        No results found for any visits on 04/19/24.  The 10-year ASCVD risk score (Arnett DK, et al., 2019) is: 13%     Assessment & Plan:   Assessment & Plan Hypertension associated with diabetes (HCC) BP well controlled on current treatment. Recommend continue Lisinopril  20 mg daily. Reviewed CMP from 09/2023 which was normal. Refill sent.  Orders:   lisinopril  (ZESTRIL ) 20 MG tablet; Take 1 tablet (20 mg total) by mouth daily.  Hyperlipidemia associated with type 2 diabetes mellitus (HCC) Elevated  LDL at 118 mg/dL. The 10-year ASCVD risk score (Arnett DK, et al., 2019) is: 13%. Hesitant to start stain due to family member who had adverse reaction to statin. Discussed continuing lifestyle  changes and potential statin therapy. Emphasized shared decision-making. - Schedule fasting labs to reassess cholesterol levels. - Consider rosuvastatin 5 mg at bedtime if LDL remains over 100 mg/dL. - Continue lifestyle modifications, including Mediterranean diet and regular exercise.  Orders:   Lipid panel; Future  Diet-controlled type 2 diabetes mellitus (HCC) Continue lifestyle modifications, including low carbohydrate diet and regular exercise. Repeat A1c. Recommend updating flu, shingles, and pneumonia vaccines at local pharmacy. Orders:   Hemoglobin A1c; Future  Multiple nevi Refer to dermatology for annual skin exam due to sun exposure and nevi.  Orders:   Ambulatory referral to Dermatology   Return in about 3 months (around 07/20/2024) for Fasting labs, first thing in the morning and appointment with Dr. Abbey .   Luke Abbey, MD

## 2024-04-20 ENCOUNTER — Other Ambulatory Visit (INDEPENDENT_AMBULATORY_CARE_PROVIDER_SITE_OTHER)

## 2024-04-20 ENCOUNTER — Ambulatory Visit: Payer: Self-pay

## 2024-04-20 DIAGNOSIS — E119 Type 2 diabetes mellitus without complications: Secondary | ICD-10-CM | POA: Diagnosis not present

## 2024-04-20 DIAGNOSIS — E785 Hyperlipidemia, unspecified: Secondary | ICD-10-CM

## 2024-04-20 DIAGNOSIS — E1169 Type 2 diabetes mellitus with other specified complication: Secondary | ICD-10-CM

## 2024-04-20 LAB — HEMOGLOBIN A1C: Hgb A1c MFr Bld: 6.2 % (ref 4.6–6.5)

## 2024-04-20 LAB — LIPID PANEL
Cholesterol: 175 mg/dL (ref 0–200)
HDL: 34.5 mg/dL — ABNORMAL LOW (ref >39.00)
LDL Cholesterol: 111 mg/dL — ABNORMAL HIGH (ref 0–99)
NonHDL: 140.86
Total CHOL/HDL Ratio: 5
Triglycerides: 147 mg/dL (ref 0.0–149.0)
VLDL: 29.4 mg/dL (ref 0.0–40.0)

## 2024-04-21 NOTE — Progress Notes (Signed)
 I sent a mychart message to the patient with his results and recommendations with the following message but he has not checked the message yet. Can we please try calling him and if he agrees signing on pending orders.   Thank you,  Scott Shade, MD  Mychart Message:   Hello Scott Friedman,    Thank you for updating labs. Your LDL cholesterol or bad cholesterol is slightly improved compared to last year and so is good cholesterol or HDL. However, your 10 years risk of cholesterol related adverse health outcome is still over 10%. As discussed during our visit yesterday, I recommend we start you on Rosuvastatin 5 mg once daily to help reduce this risk. I also recommend follow up in 2-3 months with repeat liver function, fasting cholesterol lab. If you are okay with this I will go ahead and send the prescription.    Your A1c which represents average blood glucose continues to be in prediabetic range.  As discussed during our visit please continue healthy lifestyle.    Let me know if you have questions or concerns with this plan.  Take care,  Scott Mesa, MD

## 2024-05-18 ENCOUNTER — Ambulatory Visit

## 2024-05-18 DIAGNOSIS — D489 Neoplasm of uncertain behavior, unspecified: Secondary | ICD-10-CM

## 2024-05-18 DIAGNOSIS — Z1283 Encounter for screening for malignant neoplasm of skin: Secondary | ICD-10-CM | POA: Diagnosis not present

## 2024-05-18 DIAGNOSIS — D2261 Melanocytic nevi of right upper limb, including shoulder: Secondary | ICD-10-CM

## 2024-05-18 DIAGNOSIS — L570 Actinic keratosis: Secondary | ICD-10-CM

## 2024-05-18 DIAGNOSIS — D1801 Hemangioma of skin and subcutaneous tissue: Secondary | ICD-10-CM

## 2024-05-18 DIAGNOSIS — L578 Other skin changes due to chronic exposure to nonionizing radiation: Secondary | ICD-10-CM | POA: Diagnosis not present

## 2024-05-18 DIAGNOSIS — L814 Other melanin hyperpigmentation: Secondary | ICD-10-CM

## 2024-05-18 DIAGNOSIS — W908XXA Exposure to other nonionizing radiation, initial encounter: Secondary | ICD-10-CM

## 2024-05-18 DIAGNOSIS — L821 Other seborrheic keratosis: Secondary | ICD-10-CM

## 2024-05-18 DIAGNOSIS — D229 Melanocytic nevi, unspecified: Secondary | ICD-10-CM

## 2024-05-18 NOTE — Patient Instructions (Addendum)

## 2024-05-18 NOTE — Progress Notes (Signed)
 Subjective   Scott Friedman is a 53 y.o. male who presents for the following: Total body skin exam for skin cancer screening and mole check. The patient has spots, moles and lesions to be evaluated, some may be new or changing and the patient may have concern these could be cancer.. Patient is new patient  Today patient reports: No areas of concern.   Review of Systems:    No other skin or systemic complaints except as noted in HPI or Assessment and Plan.  The following portions of the chart were reviewed this encounter and updated as appropriate: medications, allergies, medical history  Relevant Medical History:  n/a   Objective  (SKPE) Well appearing patient in no apparent distress; mood and affect are within normal limits. Examination was performed of the: Full Skin Examination: scalp, head, eyes, ears, nose, lips, neck, chest, axillae, abdomen, back, buttocks, bilateral upper extremities, bilateral lower extremities, hands, feet, fingers, toes, fingernails, and toenails.   Examination notable for: SKIN EXAM, Angioma(s): Scattered red vascular papule(s)  , Lentigo/lentigines: Scattered pigmented macules that are tan to brown in color and are somewhat non-uniform in shape and concentrated in the sun-exposed areas, Nevus/nevi: Scattered well-demarcated, regular, pigmented macule(s) and/or papule(s)  , Seborrheic Keratosis(es): Stuck-on appearing keratotic papule(s) on the trunk, none  irritated with redness, crusting, edema, and/or partial avulsion, Actinic Damage/Elastosis: chronic sun damage: dyspigmentation, telangiectasia, and wrinkling, Actinic keratosis: Scaly erythematous macule(s) concentrated on sun exposed areas   Examination limited by: Undergarments and Patient deferred removal     Right Forearm 1cm irregular pink brown macule   Face and bilateral ears (5) Pink scaly macules  Assessment & Plan  (SKAP)   SKIN CANCER SCREENING PERFORMED TODAY.  BENIGN SKIN FINDINGS  -  Lentigines  - Seborrheic keratoses  - Hemangiomas   - Nevus/Multiple Benign Nevi - Reassurance provided regarding the benign appearance of lesions noted on exam today; no treatment is indicated in the absence of symptoms/changes. - Reinforced importance of photoprotective strategies including liberal and frequent sunscreen use of a broad-spectrum SPF 30 or greater, use of protective clothing, and sun avoidance for prevention of cutaneous malignancy and photoaging.  Counseled patient on the importance of regular self-skin monitoring as well as routine clinical skin examinations as scheduled.   ACTINIC DAMAGE - Chronic condition, secondary to cumulative UV/sun exposure - Recommend daily broad spectrum sunscreen SPF 30+ to sun-exposed areas, reapply every 2 hours as needed.  - Staying in the shade or wearing long sleeves, sun glasses (UVA+UVB protection) and wide brim hats (4-inch brim around the entire circumference of the hat) are also recommended for sun protection.  - Call for new or changing lesions.  Level of service outlined above   Patient instructions (SKPI)   Procedures, orders, diagnosis for this visit:  NEOPLASM OF UNCERTAIN BEHAVIOR Right Forearm Skin / nail biopsy Type of biopsy: tangential   Informed consent: discussed and consent obtained   Timeout: patient name, date of birth, surgical site, and procedure verified   Procedure prep:  Patient was prepped and draped in usual sterile fashion Prep type:  Isopropyl alcohol Anesthesia: the lesion was anesthetized in a standard fashion   Anesthetic:  1% lidocaine  w/ epinephrine  1-100,000 buffered w/ 8.4% NaHCO3 Instrument used: DermaBlade   Hemostasis achieved with: pressure and aluminum chloride   Outcome: patient tolerated procedure well   Post-procedure details: sterile dressing applied and wound care instructions given   Dressing type: bandage and petrolatum    Specimen 1 -  Surgical pathology Differential Diagnosis:  Melanoma vs Lentigo   Check Margins: No ACTINIC KERATOSIS (5) Face and bilateral ears (5) Actinic keratoses are precancerous spots that appear secondary to cumulative UV radiation exposure/sun exposure over time. They are chronic with expected duration over 1 year. A portion of actinic keratoses will progress to squamous cell carcinoma of the skin. It is not possible to reliably predict which spots will progress to skin cancer and so treatment is recommended to prevent development of skin cancer.  Recommend daily broad spectrum sunscreen SPF 30+ to sun-exposed areas, reapply every 2 hours as needed.  Recommend staying in the shade or wearing long sleeves, sun glasses (UVA+UVB protection) and wide brim hats (4-inch brim around the entire circumference of the hat). Call for new or changing lesions. Destruction of lesion - Face and bilateral ears (5) Complexity: simple   Destruction method: cryotherapy   Informed consent: discussed and consent obtained   Timeout:  patient name, date of birth, surgical site, and procedure verified Lesion destroyed using liquid nitrogen: Yes   Region frozen until ice ball extended beyond lesion: Yes   Cryo cycles: 1 or 2. Outcome: patient tolerated procedure well with no complications   Post-procedure details: wound care instructions given   Additional details:  Prior to procedure, discussed risks of blister formation, small wound, skin dyspigmentation, or rare scar following cryotherapy. Recommend Vaseline ointment to treated areas while healing.    Neoplasm of uncertain behavior -     Skin / nail biopsy -     Surgical pathology; Standing  Actinic keratosis -     Destruction of lesion    Return to clinic: Return in about 6 months (around 11/16/2024) for TBSE.  I, Emerick Ege, CMA am acting as scribe for Lauraine JAYSON Kanaris, MD.   Documentation: I have reviewed the above documentation for accuracy and completeness, and I agree with the above.  Lauraine JAYSON Kanaris, MD

## 2024-05-20 LAB — SURGICAL PATHOLOGY

## 2024-05-23 ENCOUNTER — Ambulatory Visit: Payer: Self-pay

## 2024-05-23 NOTE — Progress Notes (Signed)
Patient informed and voiced good understanding.

## 2024-08-17 ENCOUNTER — Ambulatory Visit

## 2024-11-16 ENCOUNTER — Ambulatory Visit
